# Patient Record
Sex: Female | Born: 1985
Health system: Southern US, Community
[De-identification: ages and names within clinical notes are randomized; demographics above are authoritative.]

## PROBLEM LIST (undated history)

## (undated) DIAGNOSIS — O009 Unspecified ectopic pregnancy without intrauterine pregnancy: Secondary | ICD-10-CM

## (undated) DIAGNOSIS — R87629 Unspecified abnormal cytological findings in specimens from vagina: Secondary | ICD-10-CM

## (undated) DIAGNOSIS — U071 COVID-19: Secondary | ICD-10-CM

## (undated) DIAGNOSIS — K5190 Ulcerative colitis, unspecified, without complications: Secondary | ICD-10-CM

## (undated) DIAGNOSIS — F419 Anxiety disorder, unspecified: Secondary | ICD-10-CM

## (undated) DIAGNOSIS — O039 Complete or unspecified spontaneous abortion without complication: Secondary | ICD-10-CM

## (undated) HISTORY — DX: Unspecified ectopic pregnancy without intrauterine pregnancy: O00.90

## (undated) HISTORY — PX: NO PAST SURGERIES: SHX2092

## (undated) HISTORY — DX: Unspecified abnormal cytological findings in specimens from vagina: R87.629

## (undated) HISTORY — DX: Anxiety disorder, unspecified: F41.9

## (undated) HISTORY — DX: Complete or unspecified spontaneous abortion without complication: O03.9

---
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
## 2011-10-28 ENCOUNTER — Emergency Department (HOSPITAL_COMMUNITY)
Admission: EM | Admit: 2011-10-28 | Discharge: 2011-10-28 | Disposition: A | Discharge status: Home / Self Care | Payer: Medicaid Other | Attending: Emergency Medicine | Admitting: Emergency Medicine

## 2011-10-28 ENCOUNTER — Encounter: Payer: Self-pay | Admitting: *Deleted

## 2011-10-28 DIAGNOSIS — R110 Nausea: Secondary | ICD-10-CM | POA: Insufficient documentation

## 2011-10-28 DIAGNOSIS — R1032 Left lower quadrant pain: Secondary | ICD-10-CM | POA: Insufficient documentation

## 2011-10-28 DIAGNOSIS — R10814 Left lower quadrant abdominal tenderness: Secondary | ICD-10-CM | POA: Insufficient documentation

## 2011-10-28 LAB — URINALYSIS, ROUTINE W REFLEX MICROSCOPIC
Glucose, UA: NEGATIVE mg/dL
Leukocytes, UA: NEGATIVE
Nitrite: NEGATIVE
pH: 6 (ref 5.0–8.0)

## 2011-10-28 LAB — URINE MICROSCOPIC-ADD ON

## 2011-10-28 LAB — DIFFERENTIAL
Lymphocytes Relative: 32 % (ref 12–46)
Lymphs Abs: 4.1 10*3/uL — ABNORMAL HIGH (ref 0.7–4.0)
Monocytes Relative: 6 % (ref 3–12)
Neutro Abs: 7.4 10*3/uL (ref 1.7–7.7)
Neutrophils Relative %: 59 % (ref 43–77)

## 2011-10-28 LAB — CBC
HCT: 42.9 % (ref 36.0–46.0)
Hemoglobin: 14.8 g/dL (ref 12.0–15.0)
MCH: 30 pg (ref 26.0–34.0)
MCHC: 34.5 g/dL (ref 30.0–36.0)
MCV: 87 fL (ref 78.0–100.0)
Platelets: 206 10*3/uL (ref 150–400)
RBC: 4.93 MIL/uL (ref 3.87–5.11)
RDW: 11.8 % (ref 11.5–15.5)
WBC: 12.6 10*3/uL — ABNORMAL HIGH (ref 4.0–10.5)

## 2011-10-28 LAB — COMPREHENSIVE METABOLIC PANEL
ALT: 24 U/L (ref 0–35)
AST: 18 U/L (ref 0–37)
Albumin: 4.2 g/dL (ref 3.5–5.2)
CO2: 28 mEq/L (ref 19–32)
Calcium: 9.7 mg/dL (ref 8.4–10.5)
Chloride: 100 mEq/L (ref 96–112)
Creatinine, Ser: 0.67 mg/dL (ref 0.50–1.10)
GFR calc non Af Amer: >90 mL/min (ref >90)
Sodium: 138 mEq/L (ref 135–145)

## 2011-10-28 LAB — PREGNANCY, URINE: Preg Test, Ur: POSITIVE

## 2011-10-28 MED ORDER — IBUPROFEN 800 MG PO TABS
800.0000 mg | ORAL_TABLET | Freq: Once | ORAL | Status: AC
  Administered 2011-10-28: 800 mg via ORAL
  Filled 2011-10-28: qty 1

## 2011-10-28 MED ORDER — OXYCODONE-ACETAMINOPHEN 5-325 MG PO TABS
1.0000 | ORAL_TABLET | Freq: Once | ORAL | Status: AC
  Administered 2011-10-28: 1 via ORAL
  Filled 2011-10-28: qty 1

## 2011-10-28 NOTE — ED Notes (Signed)
Pt given discharge instructions, paperwork, pt verbalized understanding.   

## 2011-10-28 NOTE — ED Notes (Addendum)
Pt reports having abdominal pain x 2 weeks. Pt also states taking a pregnancy test 2 weeks ago that was positive. Pt states she began bleeding approximately 2 weeks ago and complains of left lower suprapubic pain 5/10. Pt denies any nausea or vomiting. Pt denies any urinary complaints or vaginal discharge. Positive bowel sounds heard in al four quadrants.

## 2011-10-28 NOTE — ED Notes (Signed)
Pt requesting medication for pain, MD notified  

## 2011-10-28 NOTE — ED Notes (Addendum)
MD at bedside. 

## 2011-10-28 NOTE — ED Notes (Signed)
C/o LLQ pain onset yesterday; reports positive pregnancy tests x 2; states began bleeding on the 25th-hx of 2 prior miscarriages; hx of ectopic pregnancy; denies n/v/d; c/o spotting presently.

## 2011-10-28 NOTE — ED Provider Notes (Signed)
History   Scribed for Benny Lennert, MD, the patient was seen in room APA09/APA09 . This chart was scribed by Ellie Lunch.   CSN: [id number] Arrival date & time: 10/28/2011  6:38 PM   First MD Initiated Contact with Patient 10/28/11 1858      Chief Complaint  Patient presents with  . Abdominal Pain    (Consider location/radiation/quality/duration/timing/severity/associated sxs/prior treatment) HPI Cynthia Hood is a 25 y.o. female who presents to the Emergency Department complaining of constant LLQ abdominal pain since yesterday. Pt states radiates to groin. Pt states she might be pregnant at this time. Pt denies fever, cough and nausea. Pt reports similar sx with hx of ectopic pregnancy.  [facility]    History reviewed. No pertinent past medical history.  History reviewed. No pertinent past surgical history.  No family history on file.  History  Substance Use Topics  . Smoking status: Not on file  . Smokeless tobacco: Not on file  . Alcohol Use:     OB History    Grav Para Term Preterm Abortions TAB SAB Ect Mult Living   3    3           Review of Systems  Constitutional: Negative for fever and fatigue.  HENT: Negative for congestion, sinus pressure and ear discharge.   Eyes: Negative for discharge.  Respiratory: Negative for cough.   Cardiovascular: Negative for chest pain.  Gastrointestinal: Positive for nausea and abdominal pain. Negative for vomiting and diarrhea.  Genitourinary: Negative for frequency and hematuria.  Musculoskeletal: Negative for back pain.  Skin: Negative for rash.  Neurological: Negative for seizures and headaches.  Hematological: Negative.   Psychiatric/Behavioral: Negative for hallucinations.    Allergies  Review of patient's allergies indicates no known allergies.  Home Medications   Current Outpatient Rx  Name Route Sig Dispense Refill  . GOODY HEADACHE PO Oral Take 1 packet by mouth as needed. For pain     .  LORAZEPAM 0.5 MG PO TABS Oral Take 0.5 mg by mouth 2 (two) times daily as needed. For nerves     . ZOLPIDEM TARTRATE 10 MG PO TABS Oral Take 10 mg by mouth at bedtime as needed. For sleep      Triage VS BP 116/76  Pulse 77  Temp(Src) 98.1 F (36.7 C) (Oral)  Resp 20  Ht 5\' 2"  (1.575 m)  Wt 140 lb (63.504 kg)  BMI 25.61 kg/m2  SpO2 100%  LMP 08/21/2011  Physical Exam  Nursing note and vitals reviewed. Constitutional: She is oriented to person, place, and time. She appears well-developed and well-nourished.  HENT:  Head: Normocephalic and atraumatic.  Eyes: Conjunctivae and EOM are normal. No scleral icterus.  Neck: Neck supple. No thyromegaly present.  Cardiovascular: Normal rate and regular rhythm.  Exam reveals no gallop and no friction rub.   No murmur heard. Pulmonary/Chest: No stridor. She has no wheezes. She has no rales. She exhibits no tenderness.  Abdominal: She exhibits no distension. There is tenderness (moderate tenderness in LLQ). There is no rebound.  Genitourinary: There is tenderness and bleeding around the vagina.       Small amount of bleeding noted. Mild left adnexal tenderness noted. Cervix is closed and no CMT.   Musculoskeletal: Normal range of motion. She exhibits no edema.  Lymphadenopathy:    She has no cervical adenopathy.  Neurological: She is oriented to person, place, and time. Coordination normal.  Skin: No rash noted. No erythema.  Psychiatric: She has a normal mood and affect. Her behavior is normal.    ED Course  Procedures (including critical care time) DIAGNOSTIC STUDIES: Oxygen Saturation is 100% on RA, normal by my interpretation.    COORDINATION OF CARE:  Results for orders placed during the hospital encounter of 10/28/11  CBC      Component Value Range   WBC 12.6 (*) 4.0 - 10.5 (K/uL)   RBC 4.93  3.87 - 5.11 (MIL/uL)   Hemoglobin 14.8  12.0 - 15.0 (g/dL)   HCT 78.2  95.6 - 21.3 (%)   MCV 87.0  78.0 - 100.0 (fL)   MCH 30.0  26.0  - 34.0 (pg)   MCHC 34.5  30.0 - 36.0 (g/dL)   RDW 08.6  57.8 - 46.9 (%)   Platelets 206  150 - 400 (K/uL)  DIFFERENTIAL      Component Value Range   Neutrophils Relative 59  43 - 77 (%)   Neutro Abs 7.4  1.7 - 7.7 (K/uL)   Lymphocytes Relative 32  12 - 46 (%)   Lymphs Abs 4.1 (*) 0.7 - 4.0 (K/uL)   Monocytes Relative 6  3 - 12 (%)   Monocytes Absolute 0.7  0.1 - 1.0 (K/uL)   Eosinophils Relative 3  0 - 5 (%)   Eosinophils Absolute 0.3  0.0 - 0.7 (K/uL)   Basophils Relative 1  0 - 1 (%)   Basophils Absolute 0.1  0.0 - 0.1 (K/uL)  PREGNANCY, URINE      Component Value Range   Preg Test, Ur POSITIVE    URINALYSIS, ROUTINE W REFLEX MICROSCOPIC      Component Value Range   Color, Urine YELLOW  YELLOW    APPearance CLEAR  CLEAR    Specific Gravity, Urine >1.030 (*) 1.005 - 1.030    pH 6.0  5.0 - 8.0    Glucose, UA NEGATIVE  NEGATIVE (mg/dL)   Hgb urine dipstick MODERATE (*) NEGATIVE    Bilirubin Urine NEGATIVE  NEGATIVE    Ketones, ur NEGATIVE  NEGATIVE (mg/dL)   Protein, ur NEGATIVE  NEGATIVE (mg/dL)   Urobilinogen, UA 0.2  0.0 - 1.0 (mg/dL)   Nitrite NEGATIVE  NEGATIVE    Leukocytes, UA NEGATIVE  NEGATIVE   COMPREHENSIVE METABOLIC PANEL      Component Value Range   Sodium 138  135 - 145 (mEq/L)   Potassium 3.5  3.5 - 5.1 (mEq/L)   Chloride 100  96 - 112 (mEq/L)   CO2 28  19 - 32 (mEq/L)   Glucose, Bld 83  70 - 99 (mg/dL)   BUN 12  6 - 23 (mg/dL)   Creatinine, Ser 6.29  0.50 - 1.10 (mg/dL)   Calcium 9.7  8.4 - 52.8 (mg/dL)   Total Protein 7.7  6.0 - 8.3 (g/dL)   Albumin 4.2  3.5 - 5.2 (g/dL)   AST 18  0 - 37 (U/L)   ALT 24  0 - 35 (U/L)   Alkaline Phosphatase 79  39 - 117 (U/L)   Total Bilirubin 0.3  0.3 - 1.2 (mg/dL)   GFR calc non Af Amer >90  >90 (mL/min)   GFR calc Af Amer >90  >90 (mL/min)  POCT PREGNANCY, URINE      Component Value Range   Preg Test, Ur NEGATIVE    URINE MICROSCOPIC-ADD ON      Component Value Range   Squamous Epithelial / LPF RARE  RARE     WBC, UA 0-2  <  3 (WBC/hpf)   RBC / HPF 3-6  <3 (RBC/hpf)   Bacteria, UA RARE  RARE   HCG, QUANTITATIVE, PREGNANCY      Component Value Range   hCG, Beta Chain, Quant, S 88 (*) <5 (mIU/mL)    1. Abdominal pain    I spoke with gyn md and it was decided the pt could be follow up in 1-2 days  MDM   The chart was scribed for me under my direct supervision.  I personally performed the history, physical, and medical decision making and all procedures in the evaluation of this patient.Benny Lennert, MD 10/28/11 [phone]

## 2011-10-30 LAB — GC/CHLAMYDIA PROBE AMP, GENITAL
Chlamydia, DNA Probe: NEGATIVE
GC Probe Amp, Genital: NEGATIVE

## 2012-06-28 ENCOUNTER — Emergency Department (HOSPITAL_COMMUNITY)
Admission: EM | Admit: 2012-06-28 | Discharge: 2012-06-28 | Disposition: A | Discharge status: Home / Self Care | Payer: Medicaid Other | Attending: Emergency Medicine | Admitting: Emergency Medicine

## 2012-06-28 ENCOUNTER — Encounter (HOSPITAL_COMMUNITY): Payer: Self-pay

## 2012-06-28 ENCOUNTER — Emergency Department (HOSPITAL_COMMUNITY): Payer: Medicaid Other

## 2012-06-28 DIAGNOSIS — R079 Chest pain, unspecified: Secondary | ICD-10-CM | POA: Insufficient documentation

## 2012-06-28 DIAGNOSIS — R0602 Shortness of breath: Secondary | ICD-10-CM | POA: Insufficient documentation

## 2012-06-28 DIAGNOSIS — F17200 Nicotine dependence, unspecified, uncomplicated: Secondary | ICD-10-CM | POA: Insufficient documentation

## 2012-06-28 DIAGNOSIS — I498 Other specified cardiac arrhythmias: Secondary | ICD-10-CM | POA: Insufficient documentation

## 2012-06-28 DIAGNOSIS — Z79899 Other long term (current) drug therapy: Secondary | ICD-10-CM | POA: Insufficient documentation

## 2012-06-28 LAB — CBC WITH DIFFERENTIAL/PLATELET
Basophils Absolute: 0 10*3/uL (ref 0.0–0.1)
Basophils Relative: 0 % (ref 0–1)
Eosinophils Absolute: 0.1 10*3/uL (ref 0.0–0.7)
Eosinophils Relative: 0 % (ref 0–5)
MCH: 30.1 pg (ref 26.0–34.0)
MCHC: 34.6 g/dL (ref 30.0–36.0)
MCV: 86.9 fL (ref 78.0–100.0)
Neutrophils Relative %: 81 % — ABNORMAL HIGH (ref 43–77)
Platelets: 220 10*3/uL (ref 150–400)
RBC: 5.05 MIL/uL (ref 3.87–5.11)
RDW: 12 % (ref 11.5–15.5)

## 2012-06-28 LAB — BASIC METABOLIC PANEL
Calcium: 9.9 mg/dL (ref 8.4–10.5)
GFR calc Af Amer: >90 mL/min (ref >90)
GFR calc non Af Amer: >90 mL/min (ref >90)
Glucose, Bld: 118 mg/dL — ABNORMAL HIGH (ref 70–99)
Potassium: 3.5 mEq/L (ref 3.5–5.1)
Sodium: 139 mEq/L (ref 135–145)

## 2012-06-28 LAB — PROTIME-INR
INR: 1.02 (ref 0.00–1.49)
Prothrombin Time: 13.6 seconds (ref 11.6–15.2)

## 2012-06-28 LAB — APTT: aPTT: 30 seconds (ref 24–37)

## 2012-06-28 MED ORDER — FENTANYL CITRATE 0.05 MG/ML IJ SOLN
50.0000 ug | Freq: Once | INTRAMUSCULAR | Status: AC
  Administered 2012-06-28: 50 ug via INTRAVENOUS
  Filled 2012-06-28: qty 2

## 2012-06-28 MED ORDER — IOHEXOL 350 MG/ML SOLN
100.0000 mL | Freq: Once | INTRAVENOUS | Status: AC | PRN
  Administered 2012-06-28: 100 mL via INTRAVENOUS

## 2012-06-28 NOTE — ED Provider Notes (Signed)
History    This chart was scribed for Venola Castello B. Bernette Mayers, MD, MD by Smitty Pluck. The patient was seen in room APA14 and the patient's care was started at 12:23PM.   CSN: [id number]  Arrival date & time 06/28/12  1142   First MD Initiated Contact with Patient 06/28/12 1220      Chief Complaint  Patient presents with  . Chest Pain    (Consider location/radiation/quality/duration/timing/severity/associated sxs/prior treatment) Patient is a 26 y.o. female presenting with chest pain. The history is provided by the patient.  Chest Pain    Cynthia Hood is a 26 y.o. female who presents to the Emergency Department complaining of moderate nausea onset 1 day ago and mid sternal and left chest pain onset today at 10AM. Pt reports that her left arm was hurting during episode of chest pain. She reports having SOB. Denies emesis. Pt reports that she uses birth control and smokes cigarettes. Denies hx of similar symptoms, anxiety and panic attacks. Pain is aggravated by lying flat. LMP was within last 1 week. Denies recent long travel and hx of blood clots. Pain is rated at 6/10.  PCP is Dr. Christell Constant.    History reviewed. No pertinent past medical history.  History reviewed. No pertinent past surgical history.  No family history on file.  History  Substance Use Topics  . Smoking status: Current Everyday Smoker  . Smokeless tobacco: Not on file  . Alcohol Use: No    OB History    Grav Para Term Preterm Abortions TAB SAB Ect Mult Living   3    3           Review of Systems  Cardiovascular: Positive for chest pain.  All other systems reviewed and are negative.   10 Systems reviewed and all are negative for acute change except as noted in the HPI.   Allergies  Review of patient's allergies indicates no known allergies.  Home Medications   Current Outpatient Rx  Name Route Sig Dispense Refill  . ORTHO TRI-CYCLEN (28) PO Oral Take 1 tablet by mouth daily.      BP 141/85  Pulse  121  Temp 97.7 F (36.5 C) (Oral)  Resp 18  Ht 5\' 2"  (1.575 m)  SpO2 99%  LMP 06/23/2012  Physical Exam  Nursing note and vitals reviewed. Constitutional: She is oriented to person, place, and time. She appears well-developed and well-nourished.  HENT:  Head: Normocephalic and atraumatic.  Eyes: EOM are normal. Pupils are equal, round, and reactive to light.  Neck: Normal range of motion. Neck supple.  Cardiovascular: Normal heart sounds and intact distal pulses.  Tachycardia present.   Pulmonary/Chest: Effort normal and breath sounds normal.  Abdominal: Bowel sounds are normal. She exhibits no distension. There is no tenderness.  Musculoskeletal: Normal range of motion. She exhibits no edema and no tenderness.  Neurological: She is alert and oriented to person, place, and time. She has normal strength. No cranial nerve deficit or sensory deficit.  Skin: Skin is warm and dry. No rash noted.  Psychiatric: She has a normal mood and affect.    ED Course  Procedures (including critical care time) DIAGNOSTIC STUDIES: Oxygen Saturation is 99% on room air, normal by my interpretation.    COORDINATION OF CARE: 12:26PM EDP discusses pt ED treatment with pt  12:26PM EDP orders medication:  Scheduled Meds:   . fentaNYL  50 mcg Intravenous Once   Continuous Infusions:  PRN Meds:.    Labs Reviewed -  No data to display No results found.   No diagnosis found.    MDM   Date: 06/28/2012  Rate: 115  Rhythm: sinus tachycardia  QRS Axis: right  Intervals: normal  ST/T Wave abnormalities: normal  Conduction Disutrbances:none  Narrative Interpretation:   Old EKG Reviewed: none available     I personally performed the services described in the documentation, which were scribed in my presence. The recorded information has been reviewed and considered.   Pt with CP, tachycardia who is on birth control and a smoker. Sent for CTA which was neg for PE. HR improved with rest.  Still having some pain, but improved. No concern for CAD.Advised to followup with PCP as needed.       Srinidhi Landers B. Bernette Mayers, MD 06/28/12 1358

## 2012-06-28 NOTE — ED Notes (Signed)
Pt reports left sided chest pain that began a few hours ago.  Pt reports the pain began to radiate to her left arm.  Pt also reports diaphoresis, dizziness, and some sob.  Pt denies any drug use.

## 2013-04-18 ENCOUNTER — Encounter: Payer: Self-pay | Admitting: Adult Health

## 2013-04-18 ENCOUNTER — Ambulatory Visit (INDEPENDENT_AMBULATORY_CARE_PROVIDER_SITE_OTHER): Payer: Medicaid Other | Admitting: Adult Health

## 2013-04-18 ENCOUNTER — Encounter: Payer: Self-pay | Admitting: *Deleted

## 2013-04-18 VITALS — BP 110/72 | Ht 62.0 in | Wt 150.8 lb

## 2013-04-18 DIAGNOSIS — Z3201 Encounter for pregnancy test, result positive: Secondary | ICD-10-CM

## 2013-04-18 DIAGNOSIS — Z3200 Encounter for pregnancy test, result unknown: Secondary | ICD-10-CM

## 2013-04-18 DIAGNOSIS — O09291 Supervision of pregnancy with other poor reproductive or obstetric history, first trimester: Secondary | ICD-10-CM

## 2013-04-18 DIAGNOSIS — O09299 Supervision of pregnancy with other poor reproductive or obstetric history, unspecified trimester: Secondary | ICD-10-CM

## 2013-04-18 NOTE — Patient Instructions (Addendum)
Call for labs in am return in 1 week for [place]

## 2013-04-18 NOTE — Progress Notes (Signed)
Subjective:     Patient ID: Cynthia Hood, female   DOB: July 16, 1986, 27 y.o.   MRN: [id number]  HPI Cynthia Hood is a 27 year old white female in with + pregnancy test at home, and history of 3 miscarriage one being ? ectoptic.  Review of Systems No complaints Reviewed past medical,surgical, social and family history. Reviewed medications and allergies.     Objective:   Physical Exam Blood pressure 110/72, height 5\' 2"  (1.575 m), weight 150 lb 12.8 oz (68.402 kg), last menstrual period 03/19/2013.Positive urine pregnancy test Got prenatal vitamins at clinic, and wants early labs, will check QHCG and progesterone level, her blood type is 0+.     Assessment:      Early pregnancy G4P0    Plan:      Check QHCG and progesterone level Take vitamins Call in am for results Return in 1 week for [place]

## 2013-04-19 ENCOUNTER — Telehealth: Payer: Self-pay | Admitting: Adult Health

## 2013-04-19 LAB — HCG, QUANTITATIVE, PREGNANCY: hCG, Beta Chain, Quant, S: 47.7 m[IU]/mL

## 2013-04-19 MED ORDER — PROGESTERONE 50 MG VA SUPP: 50.0000 mg | Freq: Two times a day (BID) | VAGINAL | Status: DC

## 2013-04-19 NOTE — Telephone Encounter (Signed)
Pt has QHCG of 47.7 and progesterone level of 5 will Rx progesterone supp 50 mg 1 bid and re check [facility] 5/29 at 3pm.Pt aware of this.

## 2013-04-19 NOTE — Telephone Encounter (Signed)
Pt informed of QHCG level of 47.7 and Progesterone level of 5.0, informed pt would discuss results with Cyril Mourning, NP and would follow up with patient.

## 2013-04-20 ENCOUNTER — Other Ambulatory Visit: Payer: Medicaid Other

## 2013-04-20 DIAGNOSIS — O200 Threatened abortion: Secondary | ICD-10-CM

## 2013-04-21 ENCOUNTER — Telehealth: Payer: Self-pay | Admitting: Adult Health

## 2013-04-21 LAB — HCG, QUANTITATIVE, PREGNANCY: hCG, Beta Chain, Quant, S: 50.1 m[IU]/mL

## 2013-04-21 NOTE — Telephone Encounter (Signed)
Pt informed of QHCG results of 50.1, appt made for Monday for recheck [facility] per Cyril Mourning, NP

## 2013-04-24 ENCOUNTER — Other Ambulatory Visit: Payer: Medicaid Other

## 2013-04-24 DIAGNOSIS — O200 Threatened abortion: Secondary | ICD-10-CM

## 2013-04-25 ENCOUNTER — Encounter: Payer: Self-pay | Admitting: Adult Health

## 2013-04-25 ENCOUNTER — Ambulatory Visit (INDEPENDENT_AMBULATORY_CARE_PROVIDER_SITE_OTHER): Payer: Medicaid Other | Admitting: Adult Health

## 2013-04-25 ENCOUNTER — Ambulatory Visit (INDEPENDENT_AMBULATORY_CARE_PROVIDER_SITE_OTHER): Payer: Medicaid Other

## 2013-04-25 ENCOUNTER — Other Ambulatory Visit: Payer: Self-pay | Admitting: Adult Health

## 2013-04-25 ENCOUNTER — Encounter: Payer: Medicaid Other | Admitting: Adult Health

## 2013-04-25 ENCOUNTER — Telehealth: Payer: Self-pay | Admitting: Obstetrics and Gynecology

## 2013-04-25 VITALS — BP 110/72 | Ht 62.0 in | Wt 153.0 lb

## 2013-04-25 DIAGNOSIS — O3680X0 Pregnancy with inconclusive fetal viability, not applicable or unspecified: Secondary | ICD-10-CM

## 2013-04-25 DIAGNOSIS — O09291 Supervision of pregnancy with other poor reproductive or obstetric history, first trimester: Secondary | ICD-10-CM

## 2013-04-25 DIAGNOSIS — O200 Threatened abortion: Secondary | ICD-10-CM

## 2013-04-25 NOTE — Progress Notes (Signed)
Subjective:     Patient ID: Cynthia Hood, female   DOB: January 31, 1986, 27 y.o.   MRN: [id number]  HPI Cynthia Hood is a 27 year old white female in for [place]. Her LMP was 03/19/13 and she had a + home pregnancy test 5/15.Her QHCG here was 47.7 on 5/27 and her progesterone was 5.She was started on progesterone 50 mg supp bid. On 5/29 her QHCG was 50.1, on 6/2 her QHCG was 169.9.Today the US showed no definite IUP,right and left ovary was seen, ?mass inferior to left ovary, cannot rule out ectopic. She is wiping pink today, no pain. Review of Systems Positives as in HPI  Reviewed past medical,surgical, social and family history. Reviewed medications and allergies.     Objective:   Physical Exam BP 110/72  Ht 5\' 2"  (1.575 m)  Wt 153 lb (69.4 kg)  BMI 27.98 kg/m2  LMP 03/19/2013   Discussed [place] and labs with pt.Discussed with Dr. Despina Hood, and will get follow up [facility] and [place] in 1 week Assessment:      Threatened AB, can not r/o ectopic    Plan:      Return in 1 week for [place] and [place] [facility] If has any pain go to the ER, pt voices understanding.

## 2013-04-25 NOTE — Patient Instructions (Addendum)
Come back in 1 week for pelvic US and QHCG  If has pain go to ER

## 2013-04-25 NOTE — Telephone Encounter (Signed)
Pt informed of QHCG results of 169.9 and to keep her appt today for U/S per Cyril Mourning, NP.

## 2013-04-26 ENCOUNTER — Other Ambulatory Visit: Payer: Self-pay | Admitting: Obstetrics & Gynecology

## 2013-04-26 DIAGNOSIS — O200 Threatened abortion: Secondary | ICD-10-CM

## 2013-04-26 NOTE — Progress Notes (Signed)
This encounter was created in error - please disregard.

## 2013-05-02 ENCOUNTER — Ambulatory Visit (INDEPENDENT_AMBULATORY_CARE_PROVIDER_SITE_OTHER): Payer: Medicaid Other | Admitting: Adult Health

## 2013-05-02 ENCOUNTER — Encounter: Payer: Self-pay | Admitting: Adult Health

## 2013-05-02 ENCOUNTER — Ambulatory Visit (INDEPENDENT_AMBULATORY_CARE_PROVIDER_SITE_OTHER): Payer: Medicaid Other

## 2013-05-02 VITALS — BP 112/76 | Ht 62.0 in | Wt 153.0 lb

## 2013-05-02 DIAGNOSIS — O200 Threatened abortion: Secondary | ICD-10-CM

## 2013-05-02 NOTE — Patient Instructions (Addendum)
Will call this pm 

## 2013-05-02 NOTE — Progress Notes (Signed)
Subjective:     Patient ID: Cynthia Hood, female   DOB: 09-21-1986, 27 y.o.   MRN: [id number]  HPI Cynthia Hood is here for follow up US and QHCG.  Review of Systems No complaints Reviewed past medical,surgical, social and family history. Reviewed medications and allergies.     Objective:   Physical Exam BP 112/76  Ht 5\' 2"  (1.575 m)  Wt 153 lb (69.4 kg)  BMI 27.98 kg/m2  LMP 03/19/2013   Reviewed [place], still no IUP, and can not r/o ectopic yet Assessment:      Threatened miscarriage, can not r/o ectopic     Plan:      Stat QHCG Re check QHCG in 2 days

## 2013-05-03 ENCOUNTER — Telehealth: Payer: Self-pay | Admitting: Adult Health

## 2013-05-03 LAB — US OB TRANSVAGINAL

## 2013-05-03 NOTE — Telephone Encounter (Signed)
Spoke with pt. Cramping since last pm. Bleeding x 1-2 weeks, bright red/brown. Pt wants to know if you can prescribe something to speed up the process. Uses [facility] in [place]. Spoke with [employer]. Will speak with Dr. Despina Hidden when he comes in. Will get back in touch with her after 2pm today.

## 2013-05-04 ENCOUNTER — Other Ambulatory Visit: Payer: Medicaid Other

## 2013-05-09 NOTE — Telephone Encounter (Signed)
Left message. Pt to call back. Needs to schedule an appt per JAG. JSY

## 2013-05-11 ENCOUNTER — Other Ambulatory Visit: Payer: Medicaid Other

## 2013-05-11 DIAGNOSIS — O200 Threatened abortion: Secondary | ICD-10-CM

## 2013-05-12 ENCOUNTER — Telehealth: Payer: Self-pay | Admitting: Adult Health

## 2013-05-12 NOTE — Telephone Encounter (Signed)
Left message that [facility] was 6.3 and we need to recheck in 2 weeks

## 2013-05-19 DIAGNOSIS — Z3202 Encounter for pregnancy test, result negative: Secondary | ICD-10-CM | POA: Insufficient documentation

## 2013-05-19 DIAGNOSIS — K5900 Constipation, unspecified: Secondary | ICD-10-CM | POA: Insufficient documentation

## 2013-05-19 DIAGNOSIS — Z87891 Personal history of nicotine dependence: Secondary | ICD-10-CM | POA: Insufficient documentation

## 2013-05-19 DIAGNOSIS — K6289 Other specified diseases of anus and rectum: Secondary | ICD-10-CM | POA: Insufficient documentation

## 2013-05-19 DIAGNOSIS — R109 Unspecified abdominal pain: Secondary | ICD-10-CM | POA: Insufficient documentation

## 2013-05-19 DIAGNOSIS — M549 Dorsalgia, unspecified: Secondary | ICD-10-CM | POA: Insufficient documentation

## 2013-05-20 ENCOUNTER — Encounter (HOSPITAL_COMMUNITY): Payer: Self-pay

## 2013-05-20 ENCOUNTER — Emergency Department (HOSPITAL_COMMUNITY)
Admission: EM | Admit: 2013-05-20 | Discharge: 2013-05-20 | Disposition: A | Discharge status: Home / Self Care | Payer: Medicaid Other | Attending: Emergency Medicine | Admitting: Emergency Medicine

## 2013-05-20 ENCOUNTER — Emergency Department (HOSPITAL_COMMUNITY): Payer: Medicaid Other

## 2013-05-20 DIAGNOSIS — R109 Unspecified abdominal pain: Secondary | ICD-10-CM

## 2013-05-20 DIAGNOSIS — K5900 Constipation, unspecified: Secondary | ICD-10-CM

## 2013-05-20 LAB — URINALYSIS, ROUTINE W REFLEX MICROSCOPIC
Glucose, UA: NEGATIVE mg/dL
Leukocytes, UA: NEGATIVE
Protein, ur: NEGATIVE mg/dL
Specific Gravity, Urine: 1.025 (ref 1.005–1.030)
Urobilinogen, UA: 0.2 mg/dL (ref 0.0–1.0)

## 2013-05-20 LAB — PREGNANCY, URINE: Preg Test, Ur: NEGATIVE

## 2013-05-20 LAB — HCG, QUANTITATIVE, PREGNANCY: hCG, Beta Chain, Quant, S: 1 m[IU]/mL (ref <5)

## 2013-05-20 LAB — URINE MICROSCOPIC-ADD ON

## 2013-05-20 MED ORDER — HYDROCODONE-ACETAMINOPHEN 5-325 MG PO TABS
1.0000 | ORAL_TABLET | Freq: Once | ORAL | Status: AC
  Administered 2013-05-20: 1 via ORAL
  Filled 2013-05-20: qty 1

## 2013-05-20 NOTE — ED Notes (Signed)
Pain in left lower abdomen, lower back, and rectal area. Pain shooting up both sides per pt. Last normal period was April 27th, was pregnant, started bleeding the first week of June and my numbers were high, last week my numbers were low per pt. Have followed up with an OBGYN per pt.

## 2013-05-20 NOTE — ED Provider Notes (Signed)
History    CSN: [id number] Arrival date & time 05/19/13  2328  First MD Initiated Contact with Patient 05/20/13 0057     Chief Complaint  Patient presents with  . Abdominal Pain  . Back Pain  . Rectal Pain   (Consider location/radiation/quality/duration/timing/severity/associated sxs/prior Treatment) HPI HPI Comments: Cynthia Hood is a 27 y.o. female who presents to the Emergency Department complaining of Left lower abdominal pain and back pain for three days. She has had irregular bowel movements in that time. She may be pregnant. Her last period was April 27th. She had bleeding the first week of June and her HCG numbers have been going down since. She is being followed by GYN. She has an appointment next week for a retake of the HCG.    PCP Dr. Christell Constant Past Medical History  Diagnosis Date  . Ectopic pregnancy    Past Surgical History  Procedure Laterality Date  . No past surgeries     Family History  Problem Relation Age of Onset  . Cancer Other   . Diabetes Father   . Hypertension Father   . Heart disease Father    History  Substance Use Topics  . Smoking status: Former Smoker -- 12 years    Types: Cigarettes    Quit date: 04/06/2013  . Smokeless tobacco: Never Used  . Alcohol Use: Yes     Comment: occ   OB History   Grav Para Term Preterm Abortions TAB SAB Ect Mult Living   4    3          Review of Systems  Constitutional: Negative for fever.       10 Systems reviewed and are negative for acute change except as noted in the HPI.  HENT: Negative for congestion.   Eyes: Negative for discharge and redness.  Respiratory: Negative for cough and shortness of breath.   Cardiovascular: Negative for chest pain.  Gastrointestinal: Positive for abdominal pain. Negative for vomiting.  Musculoskeletal: Negative for back pain.  Skin: Negative for rash.  Neurological: Negative for syncope, numbness and headaches.  Psychiatric/Behavioral:       No behavior change.     Allergies  Review of patient's allergies indicates no known allergies.  Home Medications   Current Outpatient Rx  Name  Route  Sig  Dispense  Refill  . prenatal vitamin w/FE, FA (PRENATAL 1 + 1) 27-1 MG TABS   Oral   Take 1 tablet by mouth daily at 12 noon.         . Progesterone 50 MG SUPP   Vaginal   Place 1 suppository (50 mg total) vaginally 2 (two) times daily.   30 suppository   12    BP 126/68  Pulse 95  Temp(Src) 97.9 F (36.6 C) (Oral)  Resp 18  Ht 5\' 2"  (1.575 m)  Wt 150 lb (68.04 kg)  BMI 27.43 kg/m2  SpO2 100%  LMP 03/19/2013  Breastfeeding? Unknown Physical Exam  Nursing note and vitals reviewed. Constitutional: She appears well-developed and well-nourished.  Awake, alert, nontoxic appearance.  HENT:  Head: Normocephalic and atraumatic.  Eyes: EOM are normal. Pupils are equal, round, and reactive to light.  Neck: Normal range of motion. Neck supple.  Cardiovascular: Normal rate and intact distal pulses.   Pulmonary/Chest: Effort normal and breath sounds normal. She exhibits no tenderness.  Abdominal: Soft. Bowel sounds are normal. There is no tenderness. There is no rebound.  Musculoskeletal: She exhibits no tenderness.  Baseline  ROM, no obvious new focal weakness.  Neurological:  Mental status and motor strength appears baseline for patient and situation.  Skin: No rash noted.  Psychiatric: She has a normal mood and affect.    ED Course  Procedures (including critical care time) Results for orders placed during the hospital encounter of 05/20/13  URINALYSIS, ROUTINE W REFLEX MICROSCOPIC      Result Value Range   Color, Urine YELLOW  YELLOW   APPearance CLEAR  CLEAR   Specific Gravity, Urine 1.025  1.005 - 1.030   pH 6.0  5.0 - 8.0   Glucose, UA NEGATIVE  NEGATIVE mg/dL   Hgb urine dipstick TRACE (*) NEGATIVE   Bilirubin Urine NEGATIVE  NEGATIVE   Ketones, ur NEGATIVE  NEGATIVE mg/dL   Protein, ur NEGATIVE  NEGATIVE mg/dL    Urobilinogen, UA 0.2  0.0 - 1.0 mg/dL   Nitrite NEGATIVE  NEGATIVE   Leukocytes, UA NEGATIVE  NEGATIVE  PREGNANCY, URINE      Result Value Range   Preg Test, Ur NEGATIVE  NEGATIVE  HCG, QUANTITATIVE, PREGNANCY      Result Value Range   hCG, Beta Chain, Quant, S 1  <5 mIU/mL  URINE MICROSCOPIC-ADD ON      Result Value Range   Squamous Epithelial / LPF FEW (*) RARE   WBC, UA 0-2  <3 WBC/hpf   RBC / HPF 0-2  <3 RBC/hpf   Bacteria, UA RARE  RARE   Dg Abd Acute W/chest  05/20/2013   *RADIOLOGY REPORT*  Clinical Data: Pain in left lower quadrant.  ACUTE ABDOMEN SERIES (ABDOMEN 2 VIEW & CHEST 1 VIEW)  Comparison: CT of 06/28/2012 chest.  No prior abdominal imaging.  Findings: Frontal view of the chest demonstrates midline trachea. Normal heart size and mediastinal contours. No pleural effusion or pneumothorax.  Mild lower lobe predominant interstitial thickening is suspected. No lobar consolidation.  Abominal films demonstrate no free intraperitoneal air or significant air fluid levels on upright positioning.  Moderate amount of ascending colonic stool.  No small bowel dilatation on supine imaging. Distal gas and stool.  No abnormal abdominal calcifications.   No appendicolith.  IMPRESSION: No acute abdominal findings.  Question constipation.  Lower lobe predominant interstitial thickening is likely the sequelae of chronic bronchitis/smoking.   Original Report Authenticated By: Jeronimo Greaves, M.D.    MDM  Patient presents with left lower abdominal pain and rectal pain. She is in the process of tracking HCG to determine pregnancy. HCG was 1. Acute abdominal series shows constipation. Reviewed the results with the patient.Pt stable in ED with no significant deterioration in condition.The patient appears reasonably screened and/or stabilized for discharge and I doubt any other medical condition or other [facility] requiring further screening, evaluation, or treatment in the ED at this time prior to  discharge.  MDM Reviewed: nursing note and vitals Interpretation: labs and x-ray     Nicoletta Dress. Colon Branch, MD 05/20/13 4540

## 2013-05-29 ENCOUNTER — Telehealth: Payer: Self-pay | Admitting: Family Medicine

## 2013-05-29 NOTE — Telephone Encounter (Signed)
Appt given for tomorrow afternoon per pt request

## 2013-05-30 ENCOUNTER — Ambulatory Visit (INDEPENDENT_AMBULATORY_CARE_PROVIDER_SITE_OTHER): Payer: Medicaid Other | Admitting: Family Medicine

## 2013-05-30 ENCOUNTER — Ambulatory Visit (INDEPENDENT_AMBULATORY_CARE_PROVIDER_SITE_OTHER): Payer: Medicaid Other

## 2013-05-30 ENCOUNTER — Encounter: Payer: Self-pay | Admitting: Family Medicine

## 2013-05-30 VITALS — BP 98/67 | HR 82 | Temp 97.9°F | Ht 62.0 in | Wt 153.0 lb

## 2013-05-30 DIAGNOSIS — M5431 Sciatica, right side: Secondary | ICD-10-CM

## 2013-05-30 DIAGNOSIS — M545 Low back pain, unspecified: Secondary | ICD-10-CM

## 2013-05-30 DIAGNOSIS — M5430 Sciatica, unspecified side: Secondary | ICD-10-CM

## 2013-05-30 DIAGNOSIS — S39012A Strain of muscle, fascia and tendon of lower back, initial encounter: Secondary | ICD-10-CM

## 2013-05-30 MED ORDER — PREDNISONE 50 MG PO TABS: ORAL_TABLET | ORAL | Status: DC

## 2013-05-30 MED ORDER — CYCLOBENZAPRINE HCL 10 MG PO TABS: 10.0000 mg | ORAL_TABLET | Freq: Three times a day (TID) | ORAL | Status: DC | PRN

## 2013-05-30 NOTE — Progress Notes (Signed)
  Subjective:    Patient ID: Cynthia Hood, female    DOB: Apr 15, 1986, 27 y.o.   MRN: [id number]  HPI Patient is in today with chief complaint of low back pain. Patient states she's had recurrent low back pain over the past 2-3 years. 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 Has had recurrent episodes of lumbar back pain. Patient states she's had acute flare of this over the past 3-4 months. Patient denies any recent strenuous activity. Pain is predominantly in the right side with some radicular symptoms into the right thigh and distal lower extremity. No bowel or bladder anesthesia. Patient does have some mucus in the right-sided times. No known trauma. Pain seems to be worse at night. Patient has not tried anything for pain.   Review of Systems  All other systems reviewed and are negative.       Objective:   Physical Exam  Constitutional: She appears well-developed and well-nourished.  HENT:  Head: Normocephalic and atraumatic.  Eyes: Conjunctivae are normal. Pupils are equal, round, and reactive to light.  Neck: Normal range of motion.  Cardiovascular: Normal rate and regular rhythm.   Pulmonary/Chest: Effort normal.  Abdominal: Soft.  Musculoskeletal:       Back:  + TTP in R LS area  + pain over affected area with FABER maneuvers.  neurovascularly intact distally    Neurological: She is alert.  Skin: Skin is warm.   [facility] reading (PRIMARY) by  Dr. Alvester Morin                                 Lumbar xrays preliminarily negative for any fracture or dislocation.         Assessment & Plan:  Low back pain - Plan: DG Lumbar Spine 2-3 Views  Lumbosacral strain, initial encounter - Plan: predniSONE (DELTASONE) 50 MG tablet, cyclobenzaprine (FLEXERIL) 10 MG tablet  Sciatica, right - Plan: predniSONE (DELTASONE) 50 MG tablet   Will start on prednisone Flexeril for treatment. Will also refer to physical therapy has been a chronic issue. X-rays  are preliminary negative for any acute abnormality. Followup pending formal radiology read. Discuss musculoskeletal and neurovascular red flags. Followup as needed.

## 2013-06-06 ENCOUNTER — Telehealth: Payer: Self-pay | Admitting: Family Medicine

## 2013-06-06 DIAGNOSIS — M549 Dorsalgia, unspecified: Secondary | ICD-10-CM

## 2013-06-07 NOTE — Telephone Encounter (Signed)
Discussed x-ray results. Pain has improved some with medication. I don't see that a PT referral has been done so I am ordering one.   Patient lives in [place] and would prefer an appt there if there is a PT dept.

## 2013-06-20 ENCOUNTER — Ambulatory Visit (HOSPITAL_COMMUNITY)
Admission: RE | Admit: 2013-06-20 | Discharge: 2013-06-20 | Disposition: A | Discharge status: Home / Self Care | Payer: Medicaid Other | Source: Ambulatory Visit | Attending: Family Medicine | Admitting: Family Medicine

## 2013-06-20 DIAGNOSIS — IMO0001 Reserved for inherently not codable concepts without codable children: Secondary | ICD-10-CM | POA: Insufficient documentation

## 2013-06-20 DIAGNOSIS — M545 Low back pain, unspecified: Secondary | ICD-10-CM | POA: Insufficient documentation

## 2013-06-20 NOTE — Evaluation (Signed)
Physical Therapy Evaluation  Patient Details  Name: Cynthia Hood MRN: [id number] Date of Birth: [birth year]  Today's Date: 06/20/2013 Time: 1430-1520 PT Time Calculation (min): 50 min Charge;  evaluation             Visit#: 1 of 1  Re-eval:   Assessment Diagnosis: radicular pain  Authorization: medicaid    Authorization Time Period:    Authorization Visit#:   of     Past Medical History:  Past Medical History  Diagnosis Date  . Ectopic pregnancy    Past Surgical History:  Past Surgical History  Procedure Laterality Date  . No past surgeries      Subjective Symptoms/Limitations Symptoms: Pt states that her back has been bothering her off and on for three years but the pain has increased significantly in the past few months.  She states bending increases her pain the most. How long can you sit comfortably?: leans to left if not she is able to sit for 30-40 minutes How long can you stand comfortably?: immediate pain How long can you walk comfortably?: walking increases pain  but she is able to walk without stopping Pain Assessment Currently in Pain?: Yes Pain Score: 7  (worst goes up 10/10) Pain Location: Back Pain Orientation: Right Pain Type: Chronic pain Pain Radiating Towards: to mid calf area Pain Onset: More than a month ago Pain Frequency: Constant Pain Relieving Factors: drape over a ball Effect of Pain on Daily Activities: increases pain  Prior Functioning  Prior Function Vocation: Unemployed Leisure: Hobbies-yes (Comment) Comments: ; gardening  Cognition/Observation Cognition Overall Cognitive Status: Within Functional Limits for tasks assessed  Sensation/Coordination/Flexibility/Functional Tests Functional Tests Functional Tests: oswestry 29/50  Assessment RLE Strength Right Hip Flexion: 5/5 Right Hip Extension: 3+/5 Right Hip ABduction: 3+/5 Right Knee Flexion: 3+/5 Right Knee Extension: 3+/5 LLE Strength Left Hip Flexion: 5/5 Left Hip  Extension: 4/5 Left Hip ABduction: 4/5 Left Knee Flexion: 3+/5 Left Knee Extension: 3+/5 Lumbar AROM Lumbar Flexion: decreased 70% with increased pain Lumbar Extension: decreased 30%  with increased Pain Lumbar - Right Side Bend: wnl Lumbar - Left Side Bend: wnl Lumbar - Right Rotation: decreased 10% Lumbar - Left Rotation: decreased 10%  Exercise/Treatments  Stretches Active Hamstring Stretch: 1 rep;30 seconds Single Knee to Chest Stretch: 1 rep;30 seconds   Supine Bridge: 5 reps Straight Leg Raise: 5 reps Sidelying Clam: 5 reps Hip Abduction: 5 reps Prone  Straight Leg Raise: 5 reps Opposite Arm/Leg Raise: 5 reps    Physical Therapy Assessment and Plan PT Assessment and Plan Clinical Impression Statement: Pt with increeased pain with both flexion and extension both weight bearing and non-weight bearing.  Pt demonstrates regional weakness and has complaint of pain.  Pt was educated in a stabilization exercise program. PT Plan: Pt insurance covers evaluation only.  Pt evaled and given a HEP    Goals Home Exercise Program Pt/caregiver will Perform Home Exercise Program: For increased ROM;For increased strengthening PT Goal: Perform Home Exercise Program - Progress: Goal set today PT Short Term Goals Time to Complete Short Term Goals: 2 weeks  Problem List Patient Active Problem List   Diagnosis Date Noted  . History of miscarriage, currently pregnant 04/18/2013    PT Plan of Care PT Home Exercise Plan: given  GP    RUSSELL,CINDY 06/20/2013, 5:15 PM  Physician Documentation Your signature is required to indicate approval of the treatment plan as stated above.  Please sign and either send electronically or make a copy of  this report for your files and return this physician signed original.   Please mark one 1.__approve of plan  2. ___approve of plan with the following conditions.   ______________________________                                                           _____________________ Physician Signature                                                                                                             Date

## 2013-06-29 ENCOUNTER — Telehealth: Payer: Self-pay | Admitting: Adult Health

## 2013-06-29 NOTE — Telephone Encounter (Signed)
Needs to make appt with Dr Despina Hidden to talk about recurrent miscarriage

## 2013-08-15 ENCOUNTER — Encounter (HOSPITAL_COMMUNITY): Payer: Self-pay

## 2013-08-15 ENCOUNTER — Emergency Department (HOSPITAL_COMMUNITY): Payer: Medicaid Other

## 2013-08-15 ENCOUNTER — Emergency Department (HOSPITAL_COMMUNITY)
Admission: EM | Admit: 2013-08-15 | Discharge: 2013-08-15 | Disposition: A | Discharge status: Home / Self Care | Payer: Medicaid Other | Attending: Emergency Medicine | Admitting: Emergency Medicine

## 2013-08-15 DIAGNOSIS — Z3202 Encounter for pregnancy test, result negative: Secondary | ICD-10-CM | POA: Insufficient documentation

## 2013-08-15 DIAGNOSIS — R197 Diarrhea, unspecified: Secondary | ICD-10-CM | POA: Insufficient documentation

## 2013-08-15 DIAGNOSIS — K529 Noninfective gastroenteritis and colitis, unspecified: Secondary | ICD-10-CM

## 2013-08-15 DIAGNOSIS — K5289 Other specified noninfective gastroenteritis and colitis: Secondary | ICD-10-CM | POA: Insufficient documentation

## 2013-08-15 DIAGNOSIS — F17200 Nicotine dependence, unspecified, uncomplicated: Secondary | ICD-10-CM | POA: Insufficient documentation

## 2013-08-15 DIAGNOSIS — N83209 Unspecified ovarian cyst, unspecified side: Secondary | ICD-10-CM | POA: Insufficient documentation

## 2013-08-15 DIAGNOSIS — R110 Nausea: Secondary | ICD-10-CM | POA: Insufficient documentation

## 2013-08-15 LAB — CBC WITH DIFFERENTIAL/PLATELET
Basophils Absolute: 0 10*3/uL (ref 0.0–0.1)
Basophils Relative: 0 % (ref 0–1)
Lymphocytes Relative: 27 % (ref 12–46)
MCHC: 34 g/dL (ref 30.0–36.0)
Monocytes Absolute: 0.6 10*3/uL (ref 0.1–1.0)
Neutro Abs: 6 10*3/uL (ref 1.7–7.7)
Neutrophils Relative %: 64 % (ref 43–77)
Platelets: 183 10*3/uL (ref 150–400)
RDW: 11.8 % (ref 11.5–15.5)
WBC: 9.4 10*3/uL (ref 4.0–10.5)

## 2013-08-15 LAB — COMPREHENSIVE METABOLIC PANEL
ALT: 25 U/L (ref 0–35)
AST: 16 U/L (ref 0–37)
Albumin: 3.7 g/dL (ref 3.5–5.2)
CO2: 28 mEq/L (ref 19–32)
Chloride: 102 mEq/L (ref 96–112)
Creatinine, Ser: 0.78 mg/dL (ref 0.50–1.10)
Potassium: 4.1 mEq/L (ref 3.5–5.1)
Sodium: 138 mEq/L (ref 135–145)
Total Bilirubin: <0.1 mg/dL — ABNORMAL LOW (ref 0.3–1.2)

## 2013-08-15 LAB — URINALYSIS, ROUTINE W REFLEX MICROSCOPIC
Bilirubin Urine: NEGATIVE
Glucose, UA: NEGATIVE mg/dL
Specific Gravity, Urine: >1.03 — ABNORMAL HIGH (ref 1.005–1.030)
Urobilinogen, UA: 0.2 mg/dL (ref 0.0–1.0)

## 2013-08-15 LAB — URINE MICROSCOPIC-ADD ON

## 2013-08-15 LAB — POCT PREGNANCY, URINE: Preg Test, Ur: NEGATIVE

## 2013-08-15 MED ORDER — SODIUM CHLORIDE 0.9 % IV BOLUS (SEPSIS)
1000.0000 mL | Freq: Once | INTRAVENOUS | Status: AC
  Administered 2013-08-15: 1000 mL via INTRAVENOUS

## 2013-08-15 MED ORDER — IOHEXOL 300 MG/ML  SOLN
50.0000 mL | Freq: Once | INTRAMUSCULAR | Status: AC | PRN
  Administered 2013-08-15: 50 mL via ORAL

## 2013-08-15 MED ORDER — CIPROFLOXACIN HCL 500 MG PO TABS: 500.0000 mg | ORAL_TABLET | Freq: Two times a day (BID) | ORAL | Status: DC

## 2013-08-15 MED ORDER — IOHEXOL 300 MG/ML  SOLN
100.0000 mL | Freq: Once | INTRAMUSCULAR | Status: AC | PRN
  Administered 2013-08-15: 100 mL via INTRAVENOUS

## 2013-08-15 MED ORDER — ONDANSETRON HCL 4 MG PO TABS: 4.0000 mg | ORAL_TABLET | Freq: Four times a day (QID) | ORAL | Status: DC

## 2013-08-15 MED ORDER — METRONIDAZOLE 500 MG PO TABS: 500.0000 mg | ORAL_TABLET | Freq: Two times a day (BID) | ORAL | Status: DC

## 2013-08-15 MED ORDER — MORPHINE SULFATE 4 MG/ML IJ SOLN
4.0000 mg | Freq: Once | INTRAMUSCULAR | Status: AC
  Administered 2013-08-15: 4 mg via INTRAVENOUS
  Filled 2013-08-15: qty 1

## 2013-08-15 MED ORDER — OXYCODONE-ACETAMINOPHEN 5-325 MG PO TABS: 1.0000 | ORAL_TABLET | ORAL | Status: DC | PRN

## 2013-08-15 MED ORDER — ONDANSETRON HCL 4 MG/2ML IJ SOLN
4.0000 mg | Freq: Once | INTRAMUSCULAR | Status: AC
  Administered 2013-08-15: 4 mg via INTRAVENOUS
  Filled 2013-08-15: qty 2

## 2013-08-15 NOTE — ED Notes (Signed)
Pt drinking oral contrast,

## 2013-08-15 NOTE — ED Notes (Signed)
Pt c/o upper abd area burning when she wakes up for the past two mornings, abd cramping that starts around umbilicus area with nausea and diarrhea for two days as well, pt denies any urinary symptoms,

## 2013-08-15 NOTE — ED Notes (Signed)
PT reports has been waking up with burning in upper abd and nausea x 1 week.  Reports diarrhea x 2 days, denies vomiting.

## 2013-08-15 NOTE — ED Notes (Signed)
Pt ambulatory to restroom, tolerated well,  

## 2013-08-15 NOTE — ED Provider Notes (Signed)
This chart was scribed for Cynthia Maw Reilynn Lauro, DO by Dorothey Baseman, ED Scribe. This patient was seen in room APA11/APA11 and the patient's care was started at 9:12 AM.   TIME SEEN: 9:12AM  CHIEF COMPLAINT: abdominal pain  HPI:  HPI Comments: Cynthia Hood is a 27 y.o. female who presents to the Emergency Department complaining of a burning abdominal pain onset 1 week ago that began as periumbilical and is now localized to the RLQ with severe, intermittent associated cramping and burning with onset 3-4 days ago that lasts 5-10 seconds. She reports that the pain is exacerbated after eating. She reports associated right lower corner, nausea, diarrhea (3 episodes yesterday, 1 episode this morning). She denies fever, melena, vomiting, hematuria, or dysuria. Patient does not report any aggravating or alleviating symptoms. Patient denies history of abdominal surgeries or any other pertinent medical conditions. Last menstrual period was September 5.  PCP in [place]   ROS: See HPI Constitutional: no fever  Eyes: no drainage  ENT: no runny nose   Cardiovascular:  no chest pain  Resp: no SOB  GI: no vomiting, yes diarrhea, yes nausea   GU: no dysuria, no hematuria  Integumentary: no rash  Allergy: no hives  Musculoskeletal: no leg swelling  Neurological: no slurred speech ROS otherwise negative  PAST MEDICAL HISTORY/PAST SURGICAL HISTORY:  Past Medical History  Diagnosis Date  . Ectopic pregnancy     MEDICATIONS:  Prior to Admission medications   Medication Sig Start Date End Date Taking? Authorizing Provider  cyclobenzaprine (FLEXERIL) 10 MG tablet Take 1 tablet (10 mg total) by mouth 3 (three) times daily as needed for muscle spasms. 05/30/13   Doree Albee, MD  predniSONE (DELTASONE) 50 MG tablet 1 tab daily x 7 days 05/30/13   Doree Albee, MD    ALLERGIES:  No Known Allergies  SOCIAL HISTORY:  History  Substance Use Topics  . Smoking status: Current Every Day Smoker -- 0.50 packs/day  for 12 years    Types: Cigarettes  . Smokeless tobacco: Not on file  . Alcohol Use: Yes     Comment: occ    FAMILY HISTORY: Family History  Problem Relation Age of Onset  . Cancer Other   . Diabetes Father   . Hypertension Father   . Heart disease Father     EXAM:  Triage Vitals: BP 123/77  Pulse 104  Temp(Src) 98.6 F (37 C) (Oral)  Resp 20  SpO2 96%  LMP 07/28/2013  CONSTITUTIONAL: Alert and oriented and responds appropriately to questions. Well-appearing; well-nourished HEAD: Normocephalic EYES: Conjunctivae clear, PERRL ENT: normal nose; no rhinorrhea; moist mucous membranes; pharynx without lesions noted NECK: Supple, no meningismus, no LAD  CARD: RRR; S1 and S2 appreciated; no murmurs, no clicks, no rubs, no gallops RESP: Normal chest excursion without splinting or tachypnea; breath sounds clear and equal bilaterally; no wheezes, no rhonchi, no rales,  ABD/GI: Normal bowel sounds; non-distended; soft, tenderness to palpation over McBurney's point with involuntary guarding, no rebound or peritoneal signs. BACK:  The back appears normal and is non-tender to palpation, there is no CVA tenderness EXT: Normal ROM in all joints; non-tender to palpation; no edema; normal capillary refill; no cyanosis    SKIN: Normal color for age and race; warm NEURO: Moves all extremities equally PSYCH: The patient's mood and manner are appropriate. Grooming and personal hygiene are appropriate.  MEDICAL DECISION MAKING: 9:15AM- Patient with tenderness to palpation at McBurney's point. Discussed concern for appendicitis. Will order a CT  scan, blood work, pain and anti-nausea medications. Discussed treatment plan with patient at bedside and patient verbalized agreement.    ED PROGRESS: CT scan shows colitis.  Patient has no history of ulcerative colitis. Family history of inflammatory bowel disease. No appendicitis.  Pt also has possible ruptured ovarian cyst.  Will dc home with return  precautions, pain meds, abx.  Pt verbalized understanding and is comfortable with plan.  I personally performed the services described in this documentation, which was scribed in my presence. The recorded information has been reviewed and is accurate.   Cynthia Maw Shevon Sian, DO 08/15/13 1623

## 2013-08-17 ENCOUNTER — Ambulatory Visit (INDEPENDENT_AMBULATORY_CARE_PROVIDER_SITE_OTHER): Payer: Medicaid Other | Admitting: Obstetrics & Gynecology

## 2013-08-17 ENCOUNTER — Encounter: Payer: Self-pay | Admitting: Obstetrics & Gynecology

## 2013-08-17 VITALS — BP 90/60 | Ht 61.0 in | Wt 155.0 lb

## 2013-08-17 DIAGNOSIS — N96 Recurrent pregnancy loss: Secondary | ICD-10-CM

## 2013-08-17 DIAGNOSIS — O09291 Supervision of pregnancy with other poor reproductive or obstetric history, first trimester: Secondary | ICD-10-CM

## 2013-08-17 MED ORDER — CLOMIPHENE CITRATE 50 MG PO TABS: 50.0000 mg | ORAL_TABLET | Freq: Every day | ORAL | Status: DC

## 2013-08-17 MED ORDER — PROGESTERONE MICRONIZED 200 MG PO CAPS: 200.0000 mg | ORAL_CAPSULE | Freq: Every day | ORAL | Status: DC

## 2013-08-17 NOTE — Progress Notes (Signed)
Patient ID: BAYAN HEDSTROM, female   DOB: [birth year], 27 y.o.   MRN: [id number] SHANAYA SCHNECK Jan 25, 1986 [id number] percent today to have a discussion her regarding recurrent miscarriage  I've gone over each one of the pregnancies with the patient  All of them were quite early and none of them had any ultrasound evidence of an intrauterine pregnancy  With her first pregnancy she was given methotrexate because it was a probable ectopic pregnancy, this was never confirmed with sonogram  Her 4 pregnancies have been by father a, father be father C. And father C. Which eliminates some recurrent genetic issues as a source  For now I will soon early luteal phase defect and treat her with Clomid and Prometrium  She is given instructions on how to take that And we'll come in and the same as she misses a cycle on the colon  She understands that it may be much more complicated than this but this is a reasonable place to start and if we maximize her progesterone and she still has a early pregnancy loss we will look for more esoteric causes,

## 2013-10-26 ENCOUNTER — Encounter (HOSPITAL_COMMUNITY)
Admission: EM | Disposition: A | Discharge status: Home / Self Care | Payer: Self-pay | Source: Home / Self Care | Attending: Emergency Medicine

## 2013-10-26 ENCOUNTER — Emergency Department (HOSPITAL_COMMUNITY): Payer: Medicaid Other

## 2013-10-26 ENCOUNTER — Emergency Department (HOSPITAL_COMMUNITY)
Admission: EM | Admit: 2013-10-26 | Discharge: 2013-10-26 | Disposition: A | Discharge status: Home / Self Care | Payer: Medicaid Other | Attending: Emergency Medicine | Admitting: Emergency Medicine

## 2013-10-26 ENCOUNTER — Encounter (HOSPITAL_COMMUNITY): Payer: Self-pay | Admitting: Emergency Medicine

## 2013-10-26 DIAGNOSIS — F17200 Nicotine dependence, unspecified, uncomplicated: Secondary | ICD-10-CM | POA: Insufficient documentation

## 2013-10-26 DIAGNOSIS — M549 Dorsalgia, unspecified: Secondary | ICD-10-CM | POA: Insufficient documentation

## 2013-10-26 DIAGNOSIS — R079 Chest pain, unspecified: Secondary | ICD-10-CM

## 2013-10-26 DIAGNOSIS — Z8719 Personal history of other diseases of the digestive system: Secondary | ICD-10-CM | POA: Insufficient documentation

## 2013-10-26 DIAGNOSIS — Z8742 Personal history of other diseases of the female genital tract: Secondary | ICD-10-CM | POA: Insufficient documentation

## 2013-10-26 DIAGNOSIS — Z3202 Encounter for pregnancy test, result negative: Secondary | ICD-10-CM | POA: Insufficient documentation

## 2013-10-26 HISTORY — DX: Ulcerative colitis, unspecified, without complications: K51.90

## 2013-10-26 LAB — D-DIMER, QUANTITATIVE (NOT AT ARMC): D-Dimer, Quant: 0.77 ug/mL-FEU — ABNORMAL HIGH (ref 0.00–0.48)

## 2013-10-26 SURGERY — EGD (ESOPHAGOGASTRODUODENOSCOPY)
Anesthesia: Moderate Sedation

## 2013-10-26 MED ORDER — IOHEXOL 350 MG/ML SOLN
100.0000 mL | Freq: Once | INTRAVENOUS | Status: AC | PRN
  Administered 2013-10-26: 100 mL via INTRAVENOUS

## 2013-10-26 MED ORDER — IBUPROFEN 600 MG PO TABS: 600.0000 mg | ORAL_TABLET | Freq: Four times a day (QID) | ORAL | Status: DC | PRN

## 2013-10-26 MED ORDER — KETOROLAC TROMETHAMINE 30 MG/ML IJ SOLN
30.0000 mg | Freq: Once | INTRAMUSCULAR | Status: AC
  Administered 2013-10-26: 30 mg via INTRAMUSCULAR
  Filled 2013-10-26: qty 1

## 2013-10-26 NOTE — Discharge Instructions (Signed)
Chest Pain (Nonspecific) °It is often hard to give a specific diagnosis for the cause of chest pain. There is always a chance that your pain could be related to something serious, such as a heart attack or a blood clot in the lungs. You need to follow up with your caregiver for further evaluation. °CAUSES  °· Heartburn. °· Pneumonia or bronchitis. °· Anxiety or stress. °· Inflammation around your heart (pericarditis) or lung (pleuritis or pleurisy). °· A blood clot in the lung. °· A collapsed lung (pneumothorax). It can develop suddenly on its own (spontaneous pneumothorax) or from injury (trauma) to the chest. °· Shingles infection (herpes zoster virus). °The chest wall is composed of bones, muscles, and cartilage. Any of these can be the source of the pain. °· The bones can be bruised by injury. °· The muscles or cartilage can be strained by coughing or overwork. °· The cartilage can be affected by inflammation and become sore (costochondritis). °DIAGNOSIS  °Lab tests or other studies, such as X-rays, electrocardiography, stress testing, or cardiac imaging, may be needed to find the cause of your pain.  °TREATMENT  °· Treatment depends on what may be causing your chest pain. Treatment may include: °· Acid blockers for heartburn. °· Anti-inflammatory medicine. °· Pain medicine for inflammatory conditions. °· Antibiotics if an infection is present. °· You may be advised to change lifestyle habits. This includes stopping smoking and avoiding alcohol, caffeine, and chocolate. °· You may be advised to keep your head raised (elevated) when sleeping. This reduces the chance of acid going backward from your stomach into your esophagus. °· Most of the time, nonspecific chest pain will improve within 2 to 3 days with rest and mild pain medicine. °HOME CARE INSTRUCTIONS  °· If antibiotics were prescribed, take your antibiotics as directed. Finish them even if you start to feel better. °· For the next few days, avoid physical  activities that bring on chest pain. Continue physical activities as directed. °· Do not smoke. °· Avoid drinking alcohol. °· Only take over-the-counter or prescription medicine for pain, discomfort, or fever as directed by your caregiver. °· Follow your caregiver's suggestions for further testing if your chest pain does not go away. °· Keep any follow-up appointments you made. If you do not go to an appointment, you could develop lasting (chronic) problems with pain. If there is any problem keeping an appointment, you must call to reschedule. °SEEK MEDICAL CARE IF:  °· You think you are having problems from the medicine you are taking. Read your medicine instructions carefully. °· Your chest pain does not go away, even after treatment. °· You develop a rash with blisters on your chest. °SEEK IMMEDIATE MEDICAL CARE IF:  °· You have increased chest pain or pain that spreads to your arm, neck, jaw, back, or abdomen. °· You develop shortness of breath, an increasing cough, or you are coughing up blood. °· You have severe back or abdominal pain, feel nauseous, or vomit. °· You develop severe weakness, fainting, or chills. °· You have a fever. °THIS IS AN EMERGENCY. Do not wait to see if the pain will go away. Get medical help at once. Call your local emergency services (911 in [place] Do not drive yourself to the hospital. °MAKE SURE YOU:  °· Understand these instructions. °· Will watch your condition. °· Will get help right away if you are not doing well or get worse. °Document Released: 08/19/2005 Document Revised: 02/01/2012 Document Reviewed: 06/14/2008 °Exit[employer]® Patient Information ©2014 [employer]   [employer]. ° °

## 2013-10-26 NOTE — ED Notes (Signed)
Pt reporting pain on left side of chest and in her back.  Reports pain began about 2 hours ago.  Denies nausea, vomiting or SOB.

## 2013-10-26 NOTE — ED Notes (Signed)
nad noted prior to dc. Dc instructions reviewed and explained. 1 script given to pt an voiced understanding. Ambulated out without difficulty.

## 2013-10-26 NOTE — ED Provider Notes (Signed)
CSN: [id number]     Arrival date & time 10/26/13  4010 History  This chart was scribed for Cynthia Baton, MD by Quintella Reichert, ED scribe.  This patient was seen in room APA04/APA04 and the patient's care was started at 7:11 AM.   Chief Complaint  Patient presents with  . Chest Pain    The history is provided by the patient. No language interpreter was used.    HPI Comments: Cynthia Hood is a 27 y.o. female who presents to the Emergency Department complaining of left-sided chest pain that began 2 hours ago while pt was urinating before work this morning.  Pain is localized to the left side and is described as sharp and "tightening."  It radiates to the left side of her back and her left shoulder and currently she states the pain is more severe in her back than in her chest.  Pain is worsened by movement.  She has not attempted to treat pain pta.  She denies SOB, fevers, abdominal pain, nausea, vomiting, or any other associated symptoms.  She denies injury to the chest.  She denies recent long travel, surgery or immobilization, hormone use, or h/o DVT/PE.  She did not eat this morning before onset of pain.  Pt notes that 1 1/2 years ago she had similar pain and was seen in the ED but does not remember her diagnosis from that visit.  She denies chronic medical conditions or regular medication usage.  She denies medication allergies.  She is a current every-day smoker.  She works as a Financial risk analyst.   Past Medical History  Diagnosis Date  . Ectopic pregnancy   . Colitis, ulcerative     Past Surgical History  Procedure Laterality Date  . No past surgeries      Family History  Problem Relation Age of Onset  . Cancer Other   . Diabetes Father   . Hypertension Father   . Heart disease Father     History  Substance Use Topics  . Smoking status: Current Every Day Smoker -- 0.50 packs/day for 12 years    Types: Cigarettes  . Smokeless tobacco: Not on file  . Alcohol Use: Yes     Comment:  occ    OB History   Grav Para Term Preterm Abortions TAB SAB Ect Mult Living   4    3           Review of Systems  Constitutional: Negative for fever.  Respiratory: Negative for shortness of breath.   Cardiovascular: Positive for chest pain. Negative for leg swelling.  Gastrointestinal: Negative for nausea, vomiting and abdominal pain.  Musculoskeletal: Positive for back pain.  All other systems reviewed and are negative.     Allergies  Review of patient's allergies indicates no known allergies.  Home Medications   Current Outpatient Rx  Name  Route  Sig  Dispense  Refill  . ibuprofen (ADVIL,MOTRIN) 600 MG tablet   Oral   Take 1 tablet (600 mg total) by mouth every 6 (six) hours as needed.   30 tablet   0    BP 120/67  Pulse 106  Temp(Src) 98.4 F (36.9 C) (Oral)  Resp 18  Ht 5\' 2"  (1.575 m)  Wt 155 lb (70.308 kg)  BMI 28.34 kg/m2  SpO2 95%  LMP 10/25/2013  Physical Exam  Nursing note and vitals reviewed. Constitutional: She is oriented to person, place, and time. She appears well-developed and well-nourished. No distress.  HENT:  Head: Normocephalic and atraumatic.  Eyes: Pupils are equal, round, and reactive to light.  Neck: Neck supple.  Cardiovascular: Normal rate, regular rhythm and normal heart sounds.   No murmur heard. Pulmonary/Chest: Effort normal and breath sounds normal. No respiratory distress. She has no wheezes. She exhibits no tenderness.  Abdominal: Soft. There is no tenderness.  Musculoskeletal: She exhibits no edema.  Neurological: She is alert and oriented to person, place, and time.  Skin: Skin is warm and dry.  Psychiatric: She has a normal mood and affect.    ED Course  Procedures (including critical care time)  DIAGNOSTIC STUDIES: Oxygen Saturation is 95% on room air, adequate by my interpretation.    COORDINATION OF CARE: 7:15 AM-Discussed treatment plan which includes EKG, Toradol, CXR and labs with pt at bedside and pt  agreed to plan.    Labs Review Labs Reviewed  D-DIMER, QUANTITATIVE - Abnormal; Notable for the following:    D-Dimer, Quant 0.77 (*)    All other components within normal limits  CREATININE, SERUM  POCT PREGNANCY, URINE    Imaging Review Dg Chest 2 View  10/26/2013   CLINICAL DATA:  Chest pain  EXAM: CHEST  2 VIEW  COMPARISON:  05/20/2013  FINDINGS: The heart size and mediastinal contours are within normal limits. Both lungs are clear. The visualized skeletal structures are unremarkable.  IMPRESSION: No active cardiopulmonary disease.   Electronically Signed   By: Marlan Palau M.D.   On: 10/26/2013 08:20   Ct Angio Chest W/cm &/or Wo Cm  10/26/2013   CLINICAL DATA:  Chest pain and shortness of breath  EXAM: CT ANGIOGRAPHY CHEST WITH CONTRAST  TECHNIQUE: Multidetector CT imaging of the chest was performed using the standard protocol during bolus administration of intravenous contrast. Multiplanar CT image reconstructions including MIPs were obtained to evaluate the vascular anatomy.  CONTRAST:  OMNIPAQUE IOHEXOL 350 MG/ML SOLN  COMPARISON:  Chest radiograph October 26, 2013 and chest CT June 28, 2012  FINDINGS: There is no demonstrable pulmonary embolus. There is no evidence of thoracic aortic aneurysm or dissection.  There is mild atelectasis in the medial right base anteriorly. Lungs are otherwise clear. There is no appreciable thoracic adenopathy. The pericardium is not thickened.  Visualized upper abdominal structures appear normal. There are no blastic or lytic bone lesions.  Review of the MIP images confirms the above findings.  IMPRESSION: No demonstrable pulmonary embolus. Mild atelectasis anterior medial right base. Lungs otherwise clear.   Electronically Signed   By: Bretta Bang M.D.   On: 10/26/2013 10:30    EKG Interpretation   None      EKG evidently reviewed by myself: Sinus arrhythmia, otherwise normal EKG  Medications  ketorolac (TORADOL) 30 MG/ML injection  30 mg (30 mg Intramuscular Given 10/26/13 0723)  iohexol (OMNIPAQUE) 350 MG/ML injection 100 mL (100 mLs Intravenous Contrast Given 10/26/13 1010)    MDM   1. Chest pain     Patient presents with chest pain.  She is nontoxic-appearing on exam. She does not have any reproducible chest pain at this time. Initial vital signs noted for a heart rate of 102. She is otherwise low risk for PE. D-dimer was used to screened and is positive. Patient will be sent for CT scan. EKG is reassuring. Patient was given Toradol with some improvement of her pain.  CT scan is negative for PE. At this time the patient can be discharged home. Pain is likely related to musculoskeletal etiology or costochondritis.  She is to followup with her primary care physician.  I personally performed the services described in this documentation, which was scribed in my presence. The recorded information has been reviewed and is accurate.    Cynthia Baton, MD 10/26/13 [phone]

## 2013-11-07 IMAGING — CR DG ABDOMEN ACUTE W/ 1V CHEST
3 series · 3 of 3 positions shown · non-contrast
Comparison: CT of 06/28/2012 chest.  No prior abdominal imaging.

CLINICAL DATA: Pain in left lower quadrant.

ACUTE ABDOMEN SERIES (ABDOMEN 2 VIEW & CHEST 1 VIEW)

[view not recorded (1 of 3)]
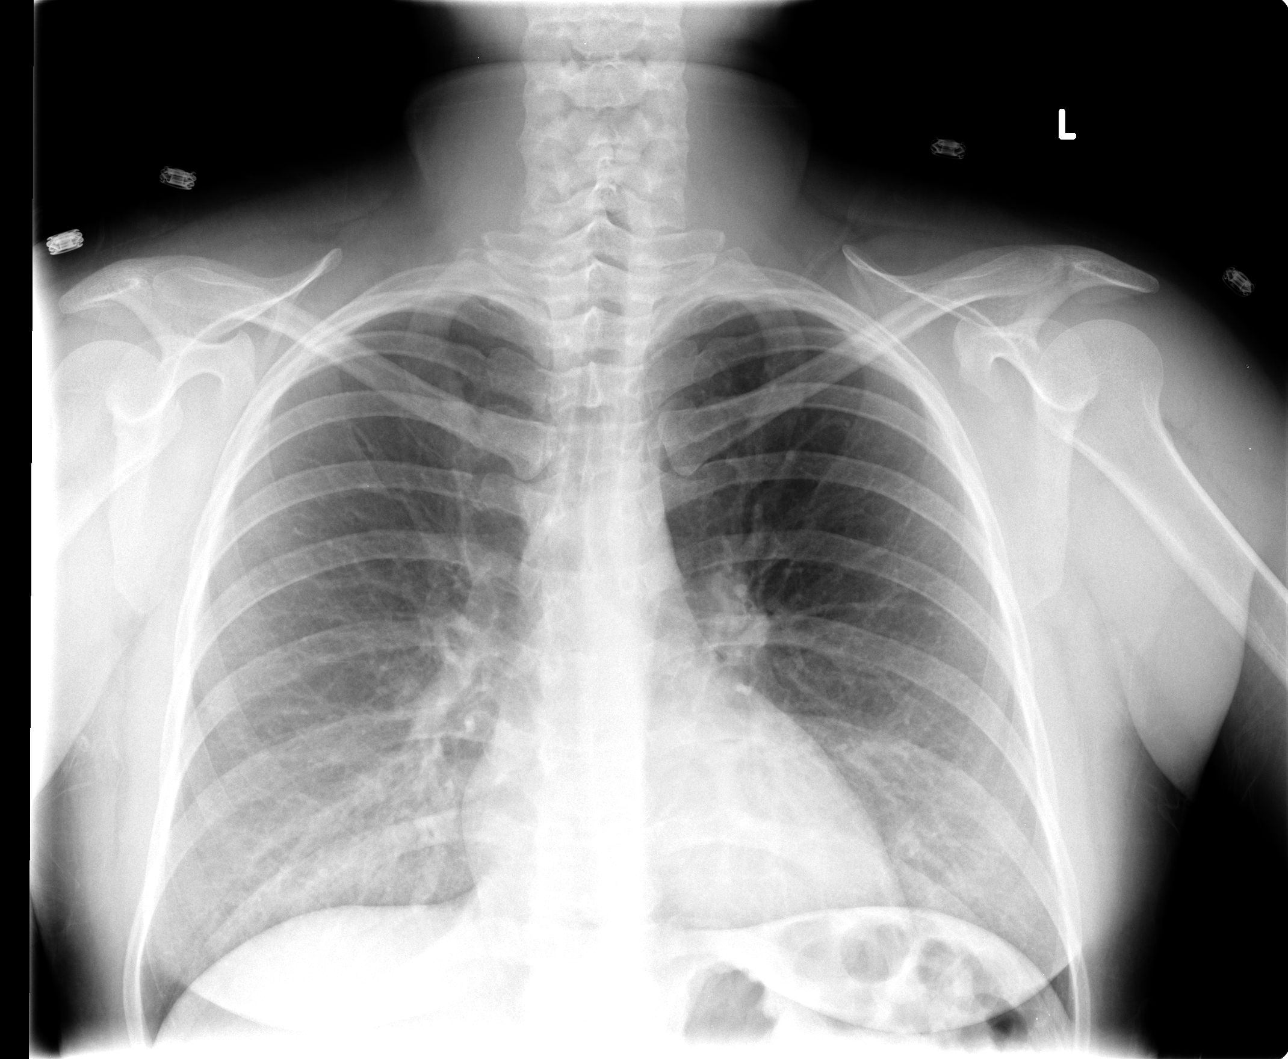

[view not recorded (2 of 3)]
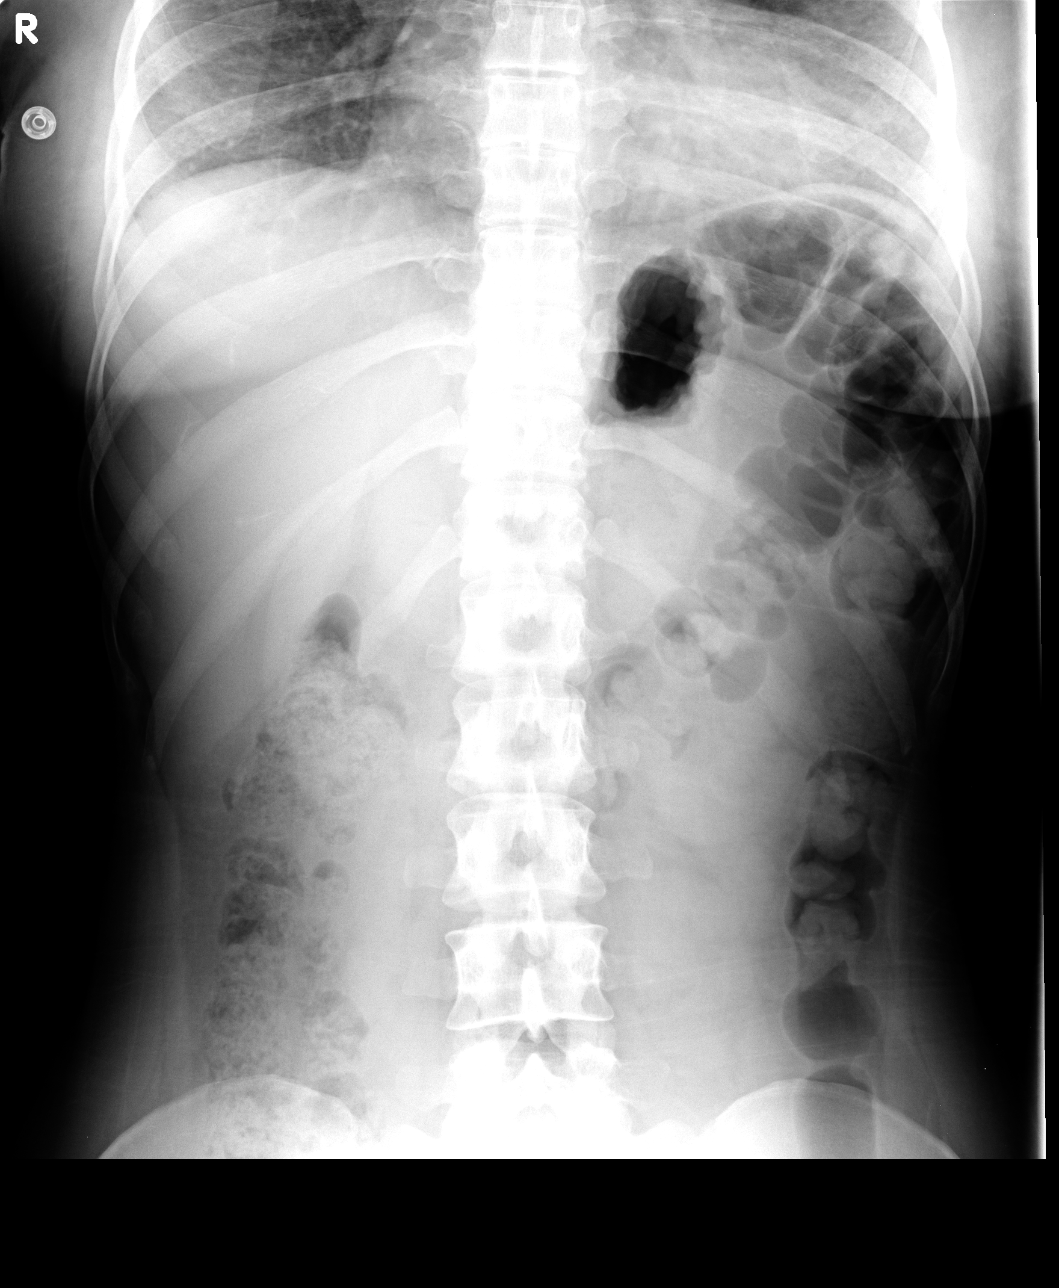

[view not recorded (3 of 3)]
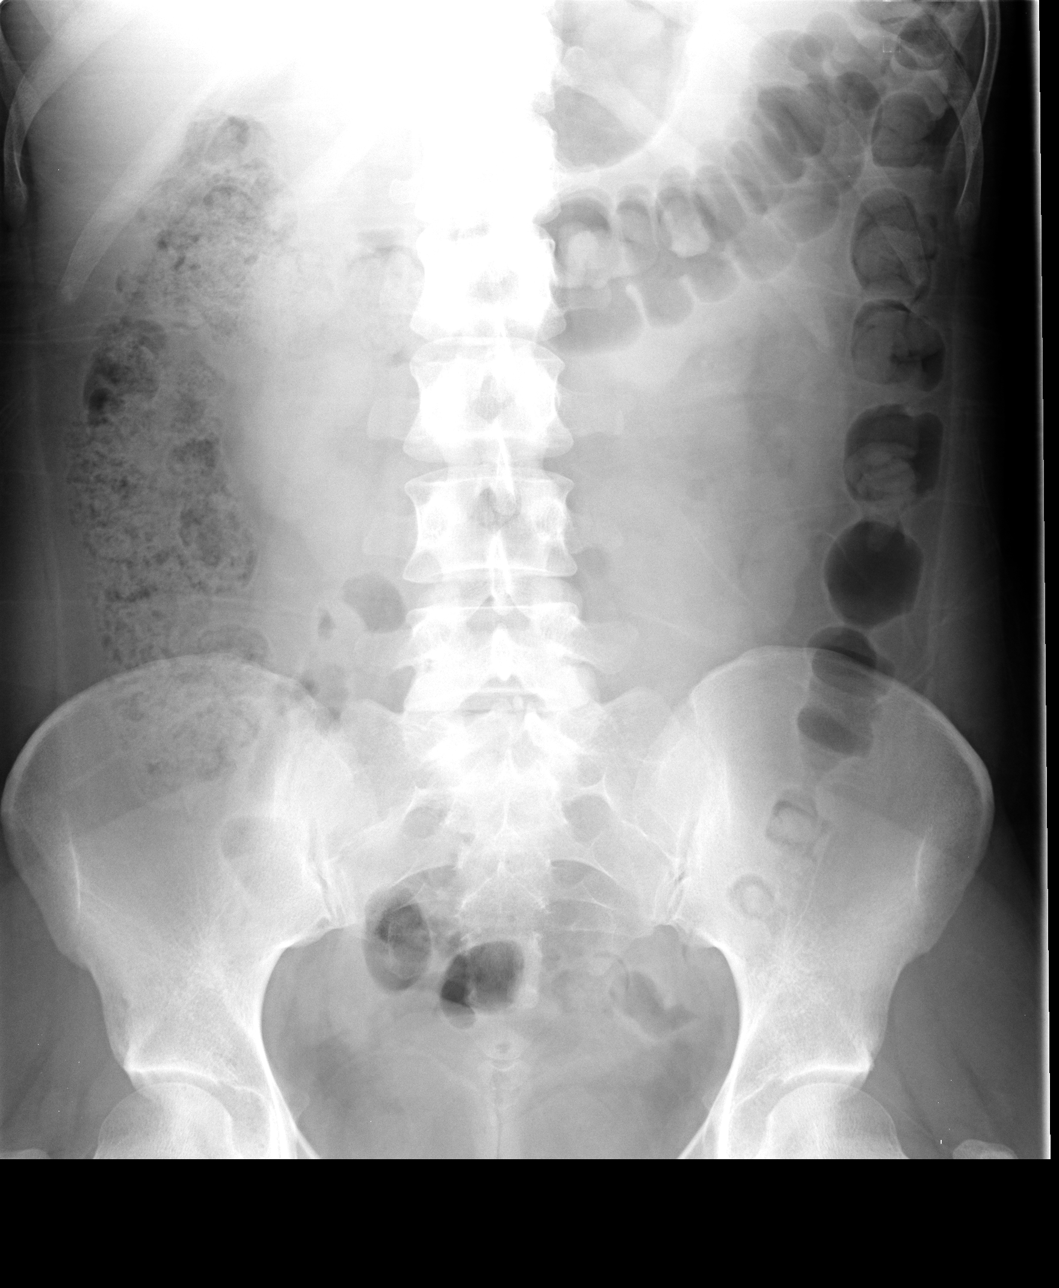

[3 of 3 positions shown; findings below may reference images not displayed]

FINDINGS: Frontal view of the chest demonstrates midline trachea.
Normal heart size and mediastinal contours. No pleural effusion or
pneumothorax.  Mild lower lobe predominant interstitial thickening
is suspected. No lobar consolidation.

Abominal films demonstrate no free intraperitoneal air or
significant air fluid levels on upright positioning.  Moderate
amount of ascending colonic stool.  No small bowel dilatation on
supine imaging. Distal gas and stool.  No abnormal abdominal
calcifications.   No appendicolith.
IMPRESSION: No acute abdominal findings.  Question constipation.

Lower lobe predominant interstitial thickening is likely the
sequelae of chronic bronchitis/smoking.

## 2013-11-23 NOTE — L&D Delivery Note (Signed)
Delivery Note At 8:37 PM a viable female was delivered via Vaginal, Spontaneous Delivery (Presentation: Right Occiput Anterior).  APGAR: 9, 9; weight pending.   Placenta status: Intact, Spontaneous.  Cord: 2 vessels with the following complications: None.    Anesthesia: Epidural  Episiotomy: None Lacerations: None Suture Repair: None Est. Blood Loss (mL): 300  Mom to postpartum.  Baby to Couplet care / Skin to Skin.  Shelbie Hutching 07/23/2014, 9:02 PM

## 2013-11-27 ENCOUNTER — Encounter (INDEPENDENT_AMBULATORY_CARE_PROVIDER_SITE_OTHER): Payer: Self-pay

## 2013-11-27 ENCOUNTER — Encounter: Payer: Self-pay | Admitting: Adult Health

## 2013-11-27 ENCOUNTER — Ambulatory Visit (INDEPENDENT_AMBULATORY_CARE_PROVIDER_SITE_OTHER): Payer: Medicaid Other | Admitting: Adult Health

## 2013-11-27 VITALS — BP 100/66 | Ht 62.0 in | Wt 166.0 lb

## 2013-11-27 DIAGNOSIS — Z3201 Encounter for pregnancy test, result positive: Secondary | ICD-10-CM

## 2013-11-27 DIAGNOSIS — Z3200 Encounter for pregnancy test, result unknown: Secondary | ICD-10-CM

## 2013-11-27 LAB — POCT URINE PREGNANCY: Preg Test, Ur: POSITIVE

## 2013-11-27 NOTE — Addendum Note (Signed)
Addended by: Farley Ly on: 11/27/2013 01:44 PM   Modules accepted: Orders

## 2013-11-28 ENCOUNTER — Telehealth: Payer: Self-pay | Admitting: Adult Health

## 2013-11-28 LAB — PROGESTERONE: Progesterone: 17.5 ng/mL

## 2013-11-28 LAB — HCG, QUANTITATIVE, PREGNANCY: HCG, BETA CHAIN, QUANT, S: 555.9 m[IU]/mL

## 2013-11-28 NOTE — Telephone Encounter (Signed)
Pt aware of labs and she has appt 1/15

## 2013-11-28 NOTE — Telephone Encounter (Signed)
No let's do a sonogram in 2 weeks, based on the levels The progesterone is excellent

## 2013-11-28 NOTE — Telephone Encounter (Signed)
Pt informed of 555.9 QHCG and 17.5 Progesterone level.

## 2013-12-01 ENCOUNTER — Other Ambulatory Visit: Payer: Self-pay | Admitting: Obstetrics & Gynecology

## 2013-12-01 DIAGNOSIS — O3680X0 Pregnancy with inconclusive fetal viability, not applicable or unspecified: Secondary | ICD-10-CM

## 2013-12-07 ENCOUNTER — Ambulatory Visit (INDEPENDENT_AMBULATORY_CARE_PROVIDER_SITE_OTHER): Payer: Medicaid Other

## 2013-12-07 ENCOUNTER — Encounter: Payer: Self-pay | Admitting: Obstetrics & Gynecology

## 2013-12-07 ENCOUNTER — Other Ambulatory Visit: Payer: Self-pay | Admitting: Obstetrics & Gynecology

## 2013-12-07 DIAGNOSIS — O09299 Supervision of pregnancy with other poor reproductive or obstetric history, unspecified trimester: Secondary | ICD-10-CM

## 2013-12-07 DIAGNOSIS — O2620 Pregnancy care for patient with recurrent pregnancy loss, unspecified trimester: Secondary | ICD-10-CM

## 2013-12-07 DIAGNOSIS — O3680X0 Pregnancy with inconclusive fetal viability, not applicable or unspecified: Secondary | ICD-10-CM

## 2013-12-07 NOTE — Progress Notes (Signed)
U/S(6+1wks)-transvaginal u/s performed, single IUP with +FCA noted, FHR- 114 bpm, cx appears long and closed, bilateral adnexa/ovaries appears wnl, CRL c/w LMP dates

## 2013-12-09 ENCOUNTER — Emergency Department (HOSPITAL_COMMUNITY)
Admission: EM | Admit: 2013-12-09 | Discharge: 2013-12-09 | Disposition: A | Discharge status: Home / Self Care | Payer: Medicaid Other | Attending: Emergency Medicine | Admitting: Emergency Medicine

## 2013-12-09 ENCOUNTER — Encounter (HOSPITAL_COMMUNITY): Payer: Self-pay | Admitting: Emergency Medicine

## 2013-12-09 ENCOUNTER — Emergency Department (HOSPITAL_COMMUNITY): Payer: Medicaid Other

## 2013-12-09 DIAGNOSIS — Z8719 Personal history of other diseases of the digestive system: Secondary | ICD-10-CM | POA: Insufficient documentation

## 2013-12-09 DIAGNOSIS — O209 Hemorrhage in early pregnancy, unspecified: Secondary | ICD-10-CM | POA: Insufficient documentation

## 2013-12-09 DIAGNOSIS — Z7982 Long term (current) use of aspirin: Secondary | ICD-10-CM | POA: Insufficient documentation

## 2013-12-09 DIAGNOSIS — F17200 Nicotine dependence, unspecified, uncomplicated: Secondary | ICD-10-CM | POA: Insufficient documentation

## 2013-12-09 DIAGNOSIS — O4690 Antepartum hemorrhage, unspecified, unspecified trimester: Secondary | ICD-10-CM

## 2013-12-09 LAB — HCG, QUANTITATIVE, PREGNANCY: hCG, Beta Chain, Quant, S: 14233 m[IU]/mL — ABNORMAL HIGH (ref <5)

## 2013-12-09 NOTE — ED Provider Notes (Signed)
Medical screening examination/treatment/procedure(s) were performed by non-physician practitioner and as supervising physician I was immediately available for consultation/collaboration.  EKG Interpretation   None        Nat Christen, MD 12/09/13 1434

## 2013-12-09 NOTE — ED Notes (Signed)
Pt in [place]

## 2013-12-09 NOTE — ED Notes (Signed)
[redacted] weeks pregnant; reports hx of 5 pregnancies, including this one, that have ended in miscarriage.  States noticed brown vaginal discharge this morning.  Pt is not wearing a pad.  The discharge is only noticed when she wipes.  Reports abdominal cramping last night, but denies pain at present.

## 2013-12-09 NOTE — Discharge Instructions (Signed)
° °Pelvic Rest °Pelvic rest is sometimes recommended for women when:  °· The placenta is partially or completely covering the opening of the cervix (placenta previa). °· There is bleeding between the uterine wall and the amniotic sac in the first trimester (subchorionic hemorrhage). °· The cervix begins to open without labor starting (incompetent cervix, cervical insufficiency). °· The labor is too early (preterm labor). °HOME CARE INSTRUCTIONS °· Do not have sexual intercourse, stimulation, or an orgasm. °· Do not use tampons, douche, or put anything in the vagina. °· Do not lift anything over 10 pounds (4.5 kg). °· Avoid strenuous activity or straining your pelvic muscles. °SEEK MEDICAL CARE IF:  °· You have any vaginal bleeding during pregnancy. Treat this as a potential emergency. °· You have cramping pain felt low in the stomach (stronger than menstrual cramps). °· You notice vaginal discharge (watery, mucus, or bloody). °· You have a low, dull backache. °· There are regular contractions or uterine tightening. °SEEK IMMEDIATE MEDICAL CARE IF: °You have vaginal bleeding and have placenta previa.  °Document Released: 03/06/2011 Document Revised: 02/01/2012 Document Reviewed: 03/06/2011 °ExitCare® Patient Information ©2014 [employer]. ° °Vaginal Bleeding During Pregnancy, First Trimester °A small amount of bleeding (spotting) from the vagina is relatively common in early pregnancy. It usually stops on its own. Various things may cause bleeding or spotting in early pregnancy. Some bleeding may be related to the pregnancy, and some may not. In most cases, the bleeding is normal and is not a problem. However, bleeding can also be a sign of something serious. Be sure to tell your health care provider about any vaginal bleeding right away. °Some possible causes of vaginal bleeding during the first trimester include: °· Infection or inflammation of the cervix. °· Growths (polyps) on the cervix. °· Miscarriage or  threatened miscarriage. °· Pregnancy tissue has developed outside of the uterus and in a fallopian tube (tubal pregnancy). °· Tiny cysts have developed in the uterus instead of pregnancy tissue (molar pregnancy). °HOME CARE INSTRUCTIONS  °Watch your condition for any changes. The following actions may help to lessen any discomfort you are feeling: °· Follow your health care provider's instructions for limiting your activity. If your health care provider orders bed rest, you may need to stay in bed and only get up to use the bathroom. However, your health care provider may allow you to continue light activity. °· If needed, make plans for someone to help with your regular activities and responsibilities while you are on bed rest. °· Keep track of the number of pads you use each day, how often you change pads, and how soaked (saturated) they are. Write this down. °· Do not use tampons. Do not douche. °· Do not have sexual intercourse or orgasms until approved by your health care provider. °· If you pass any tissue from your vagina, save the tissue so you can show it to your health care provider. °· Only take over-the-counter or prescription medicines as directed by your health care provider. °· Do not take aspirin because it can make you bleed. °· Keep all follow-up appointments as directed by your health care provider. °SEEK MEDICAL CARE IF: °· You have any vaginal bleeding during any part of your pregnancy. °· You have cramps or labor pains. °SEEK IMMEDIATE MEDICAL CARE IF:  °· You have severe cramps in your back or belly (abdomen). °· You have a fever, not controlled by medicine. °· You pass large clots or tissue from your vagina. °· Your bleeding   increases. °· You feel lightheaded or weak, or you have fainting episodes. °· You have chills. °· You are leaking fluid or have a gush of fluid from your vagina. °· You pass out while having a bowel movement. °MAKE SURE YOU: °· Understand these instructions. °· Will watch  your condition. °· Will get help right away if you are not doing well or get worse. °Document Released: 08/19/2005 Document Revised: 08/30/2013 Document Reviewed: 07/17/2013 °ExitCare® Patient Information ©2014 [employer]. ° °

## 2013-12-09 NOTE — ED Provider Notes (Signed)
CSN: [id number]     Arrival date & time 12/09/13  2585 History   First MD Initiated Contact with Patient 12/09/13 0845     Chief Complaint  Patient presents with  . Vaginal Bleeding   (Consider location/radiation/quality/duration/timing/severity/associated sxs/prior Treatment) Patient is a 28 y.o. female presenting with vaginal bleeding. The history is provided by the patient. No language interpreter was used.  Vaginal Bleeding Quality:  Spotting Severity:  Mild Onset quality:  Sudden Duration:  1 day Timing:  Constant Chronicity:  New Number of pads used:  0 Number of tampons used:  0 Possible pregnancy: yes   Relieved by:  Nothing Worsened by:  Nothing tried Associated symptoms: no abdominal pain   Risk factors: prior miscarriage   Pt has a history of multiple miscarriages.   Past Medical History  Diagnosis Date  . Ectopic pregnancy   . Colitis, ulcerative    Past Surgical History  Procedure Laterality Date  . No past surgeries     Family History  Problem Relation Age of Onset  . Cancer Other   . Diabetes Father   . Hypertension Father   . Heart disease Father    History  Substance Use Topics  . Smoking status: Light Tobacco Smoker -- 0.50 packs/day for 12 years    Types: Cigarettes  . Smokeless tobacco: Never Used  . Alcohol Use: No     Comment: occ   OB History   Grav Para Term Preterm Abortions TAB SAB Ect Mult Living   5    4  4         Review of Systems  Gastrointestinal: Negative for abdominal pain.  Genitourinary: Positive for vaginal bleeding.  All other systems reviewed and are negative.    Allergies  Review of patient's allergies indicates no known allergies.  Home Medications   Current Outpatient Rx  Name  Route  Sig  Dispense  Refill  . aspirin 81 MG tablet   Oral   Take 81 mg by mouth daily.         . progesterone 200 MG SUPP   Vaginal   Place 200 mg vaginally at bedtime.          BP 112/64  Pulse 100  Temp(Src) 98.3 F  (36.8 C) (Oral)  Resp 18  Wt 166 lb (75.297 kg)  SpO2 99%  LMP 10/25/2013 Physical Exam  Nursing note and vitals reviewed. Constitutional: She is oriented to person, place, and time. She appears well-developed and well-nourished.  HENT:  Head: Normocephalic and atraumatic.  Eyes: EOM are normal.  Neck: Normal range of motion.  Cardiovascular: Normal rate.   Pulmonary/Chest: Effort normal.  Abdominal: Soft. She exhibits no distension.  Musculoskeletal: Normal range of motion.  Neurological: She is alert and oriented to person, place, and time.  Skin: Skin is warm.  Psychiatric: She has a normal mood and affect.    ED Course  Procedures (including critical care time) Labs Review Labs Reviewed  HCG, QUANTITATIVE, PREGNANCY - Abnormal; Notable for the following:    hCG, Beta Chain, Quant, S 14233 (*)    All other components within normal limits   Imaging Review US Ob Transvaginal  12/09/2013   CLINICAL DATA:  Vaginal bleeding. Pelvic pain and cramping. Gestational age by LMP of six weeks 3 days.  EXAM: OBSTETRIC <14 WK [place] AND TRANSVAGINAL OB US  TECHNIQUE: Both transabdominal and transvaginal ultrasound examinations were performed for complete evaluation of the gestation as well as the maternal uterus, adnexal  regions, and pelvic cul-de-sac. Transvaginal technique was performed to assess early pregnancy.  COMPARISON:  None.  FINDINGS: Intrauterine gestational sac: Visualized/normal in shape.  Yolk sac:  Visualized  Embryo:  Visualized  Cardiac Activity: Visualized  Heart Rate:  121 bpm  CRL:   4  mm   6 w 1 d                  [place] EDC: 08/03/2014  Maternal uterus/adnexae: No subchorionic hemorrhage visualized. Both ovaries are normal in appearance. No mass or free fluid identified.  IMPRESSION: Single living IUP measuring 6 weeks 1 day, which is concordant with given LMP.  No significant maternal uterine or adnexal abnormality identified.   Electronically Signed   By: Earle Gell M.D.   On:  12/09/2013 10:29    EKG Interpretation   None       MDM IUP 6w1 day      1. Vaginal bleeding in pregnancy     Pt advised see Dr. Glo Herring for recheck this week.      [place], PA-C 12/09/13 1104

## 2013-12-18 ENCOUNTER — Encounter: Payer: Self-pay | Admitting: Women's Health

## 2013-12-18 ENCOUNTER — Other Ambulatory Visit: Payer: Medicaid Other

## 2013-12-18 ENCOUNTER — Ambulatory Visit (INDEPENDENT_AMBULATORY_CARE_PROVIDER_SITE_OTHER): Payer: Medicaid Other | Admitting: Women's Health

## 2013-12-18 VITALS — BP 116/58 | Temp 98.1°F | Wt 163.5 lb

## 2013-12-18 DIAGNOSIS — Z3480 Encounter for supervision of other normal pregnancy, unspecified trimester: Secondary | ICD-10-CM

## 2013-12-18 DIAGNOSIS — O2620 Pregnancy care for patient with recurrent pregnancy loss, unspecified trimester: Secondary | ICD-10-CM

## 2013-12-18 DIAGNOSIS — O09299 Supervision of pregnancy with other poor reproductive or obstetric history, unspecified trimester: Secondary | ICD-10-CM

## 2013-12-18 DIAGNOSIS — Z331 Pregnant state, incidental: Secondary | ICD-10-CM

## 2013-12-18 DIAGNOSIS — Z1389 Encounter for screening for other disorder: Secondary | ICD-10-CM

## 2013-12-18 LAB — POCT URINALYSIS DIPSTICK
Glucose, UA: NEGATIVE
Ketones, UA: NEGATIVE
Leukocytes, UA: NEGATIVE
Nitrite, UA: NEGATIVE
Protein, UA: NEGATIVE

## 2013-12-18 LAB — CBC
HEMATOCRIT: 40.1 % (ref 36.0–46.0)
Hemoglobin: 13.9 g/dL (ref 12.0–15.0)
MCH: 30.3 pg (ref 26.0–34.0)
MCHC: 34.7 g/dL (ref 30.0–36.0)
MCV: 87.4 fL (ref 78.0–100.0)
PLATELETS: 201 10*3/uL (ref 150–400)
RBC: 4.59 MIL/uL (ref 3.87–5.11)
RDW: 13 % (ref 11.5–15.5)
WBC: 10.5 10*3/uL (ref 4.0–10.5)

## 2013-12-18 MED ORDER — OB COMPLETE 50-1.25 MG PO TABS: 1.0000 | ORAL_TABLET | Freq: Every day | ORAL | Status: DC

## 2013-12-18 NOTE — Progress Notes (Addendum)
Subjective:    Cynthia Hood is a 28 y.o. G79P0040 Caucasian female at [redacted]w[redacted]d by LMP which differs from 6wk u/s by 2d, being seen today for her first obstetrical visit.  Her obstetrical history is significant for 4 SABs one of which was thought to be ectopic. 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 States she went to ED in Dec w/ CP, had an elevated d-dimer and was told she may have some sort of clotting disorder and encouraged to begin asa 81mg  daily, which she has.  She reports stopping the clomid prior to pregnancy/does not feel she conceived on it. Still taking prometrium. Progesterone 17.5 on 11/27/13. Pregnancy history fully reviewed. Reports normal pap at [facility] in [place] ~65yr ago.    Patient reports daily nausea, no vomiting- declines antiemetics. Denies vb, cramping, uti s/s, abnormal/malodorous vag d/c, or vulvovaginal itching/irritation.  Filed Vitals:   12/18/13 1419  BP: 116/58  Temp: 98.1 F (36.7 C)  Weight: 163 lb 8 oz (74.163 kg)    HISTORY: OB History  Gravida Para Term Preterm AB SAB TAB Ectopic Multiple Living  5    4 4         # Outcome Date GA Lbr Len/2nd Weight Sex Delivery Anes PTL Lv  5 CUR           4 SAB 2014          3 SAB 2014          2 SAB 2011          1 SAB 2007             Past Medical History  Diagnosis Date  . Ectopic pregnancy   . Colitis, ulcerative    Past Surgical History  Procedure Laterality Date  . No past surgeries     Family History  Problem Relation Age of Onset  . Cancer Other   . Diabetes Father   . Hypertension Father   . Heart disease Father      Exam   System:     Skin: normal coloration and turgor, no rashes    Neurologic: oriented, normal mood   Extremities: normal strength, tone, and muscle mass   HEENT PERRLA   Mouth/Teeth mucous membranes moist   Cardiovascular: regular rate and rhythm   Respiratory:   appears well, vitals normal, no respiratory distress, acyanotic, normal RR   Abdomen: soft, non-tender    Thin prep pap smear not obtained FHR: 166 via informal u/s   Assessment:    Pregnancy: G5P0040 Patient Active Problem List   Diagnosis Date Noted  . Supervision of other normal pregnancy 12/18/2013    Priority: High  . 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 08/17/2013      [redacted]w[redacted]d G5P0040 New OB visit H/O recurrent SABs H/O ectopic x 1 Nausea w/o vomiting Possible clotting disorder, taking asa daily    Plan:     Initial labs drawn Continue prenatal vitamins Problem list reviewed and updated Reviewed n/v relief measures and warning s/s to report Reviewed recommended weight gain based on pre-gravid BMI Encouraged well-balanced diet Genetic Screening discussed Integrated Screen: declined Cystic fibrosis screening discussed declined Ultrasound discussed; fetal survey: requested Follow up in 4 weeks for visit Discussed possible clotting disorder w/ LHE, no need to test d/t being on asa therapy, OK to continue asa 81mg  during  pregnancy OK to continue prometrium until 14wks To call us if she decides she would like antiemetic Recommended flu shot, states she will think about it and let us know Request pap results from [place] 12/18/2013 3:17 PM

## 2013-12-18 NOTE — Patient Instructions (Signed)
Nausea & Vomiting  Have saltine crackers or pretzels by your bed and eat a few bites before you raise your head out of bed in the morning  Eat small frequent meals throughout the day instead of large meals  Drink plenty of fluids throughout the day to stay hydrated, just don't drink a lot of fluids with your meals.  This can make your stomach fill up faster making you feel sick  Do not brush your teeth right after you eat  Products with real ginger are good for nausea, like ginger ale and ginger hard candy Make sure it says made with real ginger!  Sucking on sour candy like lemon heads is also good for nausea  If your prenatal vitamins make you nauseated, take them at night so you will sleep through the nausea  If you feel like you need medicine for the nausea & vomiting please let us know  If you are unable to keep any fluids or food down please let us know   Constipation  Drink plenty of fluid, preferably water, throughout the day  Eat foods high in fiber such as fruits, vegetables, and grains  Exercise, such as walking, is a good way to keep your bowels regular  Drink warm fluids, especially warm prune juice, or decaf coffee  Eat a 1/2 cup of real oatmeal (not instant), 1/2 cup applesauce, and 1/2-1 cup warm prune juice every day  If needed, you may take Colace (docusate sodium) stool softener once or twice a day to help keep the stool soft. If you are pregnant, wait until you are out of your first trimester (12-14 weeks of pregnancy)  If you still are having problems with constipation, you may take Miralax once daily as needed to help keep your bowels regular.  If you are pregnant, wait until you are out of your first trimester (12-14 weeks of pregnancy)    Pregnancy - First Trimester During sexual intercourse, millions of sperm go into the vagina. Only 1 sperm will penetrate and fertilize the female egg while it is in the Fallopian tube. One week later, the fertilized egg  implants into the wall of the uterus. An embryo begins to develop into a baby. At 6 to 8 weeks, the eyes and face are formed and the heartbeat can be seen on ultrasound. At the end of 12 weeks (first trimester), all the baby's organs are formed. Now that you are pregnant, you will want to do everything you can to have a healthy baby. Two of the most important things are to get good prenatal care and follow your caregiver's instructions. Prenatal care is all the medical care you receive before the baby's birth. It is given to prevent, find, and treat problems during the pregnancy and childbirth. PRENATAL EXAMS  During prenatal visits, your weight, blood pressure, and urine are checked. This is done to make sure you are healthy and progressing normally during the pregnancy.  A pregnant woman should gain 25 to 35 pounds during the pregnancy. However, if you are overweight or underweight, your caregiver will advise you regarding your weight.  Your caregiver will ask and answer questions for you.  Blood work, cervical cultures, other necessary tests, and a Pap test are done during your prenatal exams. These tests are done to check on your health and the probable health of your baby. Tests are strongly recommended and done for HIV with your permission. This is the virus that causes AIDS. These tests are done because medicines  can be given to help prevent your baby from being born with this infection should you have been infected without knowing it. Blood work is also used to find out your blood type, previous infections, and follow your blood levels (hemoglobin).  Low hemoglobin (anemia) is common during pregnancy. Iron and vitamins are given to help prevent this. Later in the pregnancy, blood tests for diabetes will be done along with any other tests if any problems develop.  You may need other tests to make sure you and the baby are doing well. CHANGES DURING THE FIRST TRIMESTER  Your body goes through  many changes during pregnancy. They vary from person to person. Talk to your caregiver about changes you notice and are concerned about. Changes can include:  Your menstrual period stops.  The egg and sperm carry the genes that determine what you look like. Genes from you and your partner are forming a baby. The female genes determine whether the baby is a boy or a girl.  Your body increases in girth and you may feel bloated.  Feeling sick to your stomach (nauseous) and throwing up (vomiting). If the vomiting is uncontrollable, call your caregiver.  Your breasts will begin to enlarge and become tender.  Your nipples may stick out more and become darker.  The need to urinate more. Painful urination may mean you have a bladder infection.  Tiring easily.  Loss of appetite.  Cravings for certain kinds of food.  At first, you may gain or lose a couple of pounds.  You may have changes in your emotions from day to day (excited to be pregnant or concerned something may go wrong with the pregnancy and baby).  You may have more vivid and strange dreams. HOME CARE INSTRUCTIONS   It is very important to avoid all smoking, alcohol and non-prescribed drugs during your pregnancy. These affect the formation and growth of the baby. Avoid chemicals while pregnant to ensure the delivery of a healthy infant.  Start your prenatal visits by the 12th week of pregnancy. They are usually scheduled monthly at first, then more often in the last 2 months before delivery. Keep your caregiver's appointments. Follow your caregiver's instructions regarding medicine use, blood and lab tests, exercise, and diet.  During pregnancy, you are providing food for you and your baby. Eat regular, well-balanced meals. Choose foods such as meat, fish, milk and other low fat dairy products, vegetables, fruits, and whole-grain breads and cereals. Your caregiver will tell you of the ideal weight gain.  You can help morning  sickness by keeping soda crackers at the bedside. Eat a couple before arising in the morning. You may want to use the crackers without salt on them.  Eating 4 to 5 small meals rather than 3 large meals a day also may help the nausea and vomiting.  Drinking liquids between meals instead of during meals also seems to help nausea and vomiting.  A physical sexual relationship may be continued throughout pregnancy if there are no other problems. Problems may be early (premature) leaking of amniotic fluid from the membranes, vaginal bleeding, or belly (abdominal) pain.  Exercise regularly if there are no restrictions. Check with your caregiver or physical therapist if you are unsure of the safety of some of your exercises. Greater weight gain will occur in the last 2 trimesters of pregnancy. Exercising will help:  Control your weight.  Keep you in shape.  Prepare you for labor and delivery.  Help you lose your pregnancy  weight after you deliver your baby.  Wear a good support or jogging bra for breast tenderness during pregnancy. This may help if worn during sleep too.  Ask when prenatal classes are available. Begin classes when they are offered.  Do not use hot tubs, steam rooms, or saunas.  Wear your seat belt when driving. This protects you and your baby if you are in an accident.  Avoid raw meat, uncooked cheese, cat litter boxes, and soil used by cats throughout the pregnancy. These carry germs that can cause birth defects in the baby.  The first trimester is a good time to visit your dentist for your dental health. Getting your teeth cleaned is okay. Use a softer toothbrush and brush gently during pregnancy.  Ask for help if you have financial, counseling, or nutritional needs during pregnancy. Your caregiver will be able to offer counseling for these needs as well as refer you for other special needs.  Do not take any medicines or herbs unless told by your caregiver.  Inform your  caregiver if there is any mental or physical domestic violence.  Make a list of emergency phone numbers of family, friends, hospital, and police and fire departments.  Write down your questions. Take them to your prenatal visit.  Do not douche.  Do not cross your legs.  If you have to stand for long periods of time, rotate you feet or take small steps in a circle.  You may have more vaginal secretions that may require a sanitary pad. Do not use tampons or scented sanitary pads. MEDICINES AND DRUG USE IN PREGNANCY  Take prenatal vitamins as directed. The vitamin should contain 1 milligram of folic acid. Keep all vitamins out of reach of children. Only a couple vitamins or tablets containing iron may be fatal to a baby or young child when ingested.  Avoid use of all medicines, including herbs, over-the-counter medicines, not prescribed or suggested by your caregiver. Only take over-the-counter or prescription medicines for pain, discomfort, or fever as directed by your caregiver. Do not use aspirin, ibuprofen, or naproxen unless directed by your caregiver.  Let your caregiver also know about herbs you may be using.  Alcohol is related to a number of birth defects. This includes fetal alcohol syndrome. All alcohol, in any form, should be avoided completely. Smoking will cause low birth rate and premature babies.  Street or illegal drugs are very harmful to the baby. They are absolutely forbidden. A baby born to an addicted mother will be addicted at birth. The baby will go through the same withdrawal an adult does.  Let your caregiver know about any medicines that you have to take and for what reason you take them. SEEK MEDICAL CARE IF:  You have any concerns or worries during your pregnancy. It is better to call with your questions if you feel they cannot wait, rather than worry about them. SEEK IMMEDIATE MEDICAL CARE IF:   An unexplained oral temperature above 102 F (38.9 C) develops,  or as your caregiver suggests.  You have leaking of fluid from the vagina (birth canal). If leaking membranes are suspected, take your temperature and inform your caregiver of this when you call.  There is vaginal spotting or bleeding. Notify your caregiver of the amount and how many pads are used.  You develop a bad smelling vaginal discharge with a change in the color.  You continue to feel sick to your stomach (nauseated) and have no relief from remedies suggested. You  vomit blood or coffee ground-like materials.  You lose more than 2 pounds of weight in 1 week.  You gain more than 2 pounds of weight in 1 week and you notice swelling of your face, hands, feet, or legs.  You gain 5 pounds or more in 1 week (even if you do not have swelling of your hands, face, legs, or feet).  You get exposed to [place] measles and have never had them.  You are exposed to fifth disease or chickenpox.  You develop belly (abdominal) pain. Round ligament discomfort is a common non-cancerous (benign) cause of abdominal pain in pregnancy. Your caregiver still must evaluate this.  You develop headache, fever, diarrhea, pain with urination, or shortness of breath.  You fall or are in a car accident or have any kind of trauma.  There is mental or physical violence in your home. Document Released: 11/03/2001 Document Revised: 08/03/2012 Document Reviewed: 05/07/2009 [facility] Patient Information 2014 [place].

## 2013-12-19 ENCOUNTER — Telehealth: Payer: Self-pay | Admitting: Women's Health

## 2013-12-19 ENCOUNTER — Encounter: Payer: Self-pay | Admitting: Women's Health

## 2013-12-19 LAB — RPR

## 2013-12-19 LAB — DRUG SCREEN, URINE, NO CONFIRMATION
Amphetamine Screen, Ur: NEGATIVE
BARBITURATE QUANT UR: NEGATIVE
Benzodiazepines.: NEGATIVE
COCAINE METABOLITES: NEGATIVE
CREATININE, U: 192.8 mg/dL
METHADONE: NEGATIVE
Marijuana Metabolite: NEGATIVE
Opiate Screen, Urine: NEGATIVE
PROPOXYPHENE: NEGATIVE
Phencyclidine (PCP): NEGATIVE

## 2013-12-19 LAB — GC/CHLAMYDIA PROBE AMP
CT Probe RNA: NEGATIVE
GC PROBE AMP APTIMA: NEGATIVE

## 2013-12-19 LAB — URINALYSIS
BILIRUBIN URINE: NEGATIVE
GLUCOSE, UA: NEGATIVE mg/dL
Hgb urine dipstick: NEGATIVE
Ketones, ur: NEGATIVE mg/dL
Leukocytes, UA: NEGATIVE
Nitrite: NEGATIVE
Protein, ur: NEGATIVE mg/dL
SPECIFIC GRAVITY, URINE: 1.024 (ref 1.005–1.030)
UROBILINOGEN UA: 0.2 mg/dL (ref 0.0–1.0)
pH: 6 (ref 5.0–8.0)

## 2013-12-19 LAB — URINE CULTURE
COLONY COUNT: NO GROWTH
ORGANISM ID, BACTERIA: NO GROWTH

## 2013-12-19 LAB — HEPATITIS B SURFACE ANTIGEN: Hepatitis B Surface Ag: NEGATIVE

## 2013-12-19 LAB — OXYCODONE SCREEN, UA, RFLX CONFIRM: OXYCODONE SCRN UR: NEGATIVE ng/mL

## 2013-12-19 LAB — RUBELLA SCREEN: Rubella: 1.28 Index — ABNORMAL HIGH (ref <0.90)

## 2013-12-19 LAB — VARICELLA ZOSTER ANTIBODY, IGG: Varicella IgG: 695.3 Index — ABNORMAL HIGH (ref <135.00)

## 2013-12-19 LAB — ABO AND RH: Rh Type: POSITIVE

## 2013-12-19 LAB — ANTIBODY SCREEN: Antibody Screen: NEGATIVE

## 2013-12-19 LAB — HIV ANTIBODY (ROUTINE TESTING W REFLEX): HIV: NONREACTIVE

## 2013-12-19 NOTE — Telephone Encounter (Signed)
Pt c/o runny nose, coughing, chest congestion, sore throat, no fever. Pt encouraged to push fluids, especially fluids with vit C, rest, hot teas, tylenol. Pt to call office back if no improvements. Pt saw Knute Neu, CNM yesterday and was told to use humidifier and nasal spray.

## 2014-01-12 ENCOUNTER — Ambulatory Visit (INDEPENDENT_AMBULATORY_CARE_PROVIDER_SITE_OTHER): Payer: Medicaid Other | Admitting: Obstetrics & Gynecology

## 2014-01-12 ENCOUNTER — Encounter: Payer: Self-pay | Admitting: Obstetrics & Gynecology

## 2014-01-12 VITALS — BP 100/60 | Wt 157.0 lb

## 2014-01-12 DIAGNOSIS — Z1389 Encounter for screening for other disorder: Secondary | ICD-10-CM

## 2014-01-12 DIAGNOSIS — O09299 Supervision of pregnancy with other poor reproductive or obstetric history, unspecified trimester: Secondary | ICD-10-CM

## 2014-01-12 DIAGNOSIS — Z331 Pregnant state, incidental: Secondary | ICD-10-CM

## 2014-01-12 DIAGNOSIS — N96 Recurrent pregnancy loss: Secondary | ICD-10-CM

## 2014-01-12 DIAGNOSIS — O2620 Pregnancy care for patient with recurrent pregnancy loss, unspecified trimester: Secondary | ICD-10-CM

## 2014-01-12 LAB — POCT URINALYSIS DIPSTICK
Blood, UA: NEGATIVE
GLUCOSE UA: NEGATIVE
KETONES UA: NEGATIVE
Leukocytes, UA: NEGATIVE
Nitrite, UA: NEGATIVE
Protein, UA: NEGATIVE

## 2014-01-12 NOTE — Progress Notes (Signed)
Declines IT, quad testing  No bleeding no problems, may stop prometrium in 2 1/2 weeks

## 2014-01-15 ENCOUNTER — Encounter: Payer: Medicaid Other | Admitting: Obstetrics & Gynecology

## 2014-02-07 ENCOUNTER — Telehealth: Payer: Self-pay | Admitting: Obstetrics & Gynecology

## 2014-02-07 NOTE — Telephone Encounter (Signed)
Pt c/o "mild cramping this past week", notice today "sharp vaginal pain", no vaginal bleeding. Pt informed to push fluids, rest as much as possible, can take tylenol if no improvement pt to call our office back. Pt encouraged to go to [facility] for vaginal bleeding or severe cramping. Pt verbalized understanding.

## 2014-02-09 ENCOUNTER — Encounter: Payer: Self-pay | Admitting: Obstetrics and Gynecology

## 2014-02-09 ENCOUNTER — Ambulatory Visit (INDEPENDENT_AMBULATORY_CARE_PROVIDER_SITE_OTHER): Payer: 59 | Admitting: Obstetrics and Gynecology

## 2014-02-09 VITALS — BP 110/76 | Wt 158.0 lb

## 2014-02-09 DIAGNOSIS — Z331 Pregnant state, incidental: Secondary | ICD-10-CM

## 2014-02-09 DIAGNOSIS — O09299 Supervision of pregnancy with other poor reproductive or obstetric history, unspecified trimester: Secondary | ICD-10-CM

## 2014-02-09 DIAGNOSIS — O2620 Pregnancy care for patient with recurrent pregnancy loss, unspecified trimester: Secondary | ICD-10-CM

## 2014-02-09 DIAGNOSIS — Z1389 Encounter for screening for other disorder: Secondary | ICD-10-CM

## 2014-02-09 DIAGNOSIS — N949 Unspecified condition associated with female genital organs and menstrual cycle: Secondary | ICD-10-CM | POA: Insufficient documentation

## 2014-02-09 DIAGNOSIS — Z3490 Encounter for supervision of normal pregnancy, unspecified, unspecified trimester: Secondary | ICD-10-CM

## 2014-02-09 LAB — POCT URINALYSIS DIPSTICK
Blood, UA: NEGATIVE
Glucose, UA: NEGATIVE
Ketones, UA: NEGATIVE
LEUKOCYTES UA: NEGATIVE
Nitrite, UA: NEGATIVE
Protein, UA: NEGATIVE

## 2014-02-09 NOTE — Patient Instructions (Addendum)
  Please check out [email or web address] and then choose pregnancy and childbirth for more information on childbirth classes   Abdominal Pain During Pregnancy Abdominal pain is common in pregnancy. Most of the time, it does not cause harm. There are many causes of abdominal pain. Some causes are more serious than others. Some of the causes of abdominal pain in pregnancy are easily diagnosed. Occasionally, the diagnosis takes time to understand. Other times, the cause is not determined. Abdominal pain can be a sign that something is very wrong with the pregnancy, or the pain may have nothing to do with the pregnancy at all. For this reason, always tell your health care provider if you have any abdominal discomfort. HOME CARE INSTRUCTIONS  Monitor your abdominal pain for any changes. The following actions may help to alleviate any discomfort you are experiencing:  Do not have sexual intercourse or put anything in your vagina until your symptoms go away completely.  Get plenty of rest until your pain improves.  Drink clear fluids if you feel nauseous. Avoid solid food as long as you are uncomfortable or nauseous.  Only take over-the-counter or prescription medicine as directed by your health care provider.  Keep all follow-up appointments with your health care provider. SEEK IMMEDIATE MEDICAL CARE IF:  You are bleeding, leaking fluid, or passing tissue from the vagina.  You have increasing pain or cramping.  You have persistent vomiting.  You have painful or bloody urination.  You have a fever.  You notice a decrease in your baby's movements.  You have extreme weakness or feel faint.  You have shortness of breath, with or without abdominal pain.  You develop a severe headache with abdominal pain.  You have abnormal vaginal discharge with abdominal pain.  You have persistent diarrhea.  You have abdominal pain that continues even after rest, or gets  worse. MAKE SURE YOU:   Understand these instructions.  Will watch your condition.  Will get help right away if you are not doing well or get worse. Document Released: 11/09/2005 Document Revised: 08/30/2013 Document Reviewed: 06/08/2013 [facility] Patient Information 2014 [place], [place].

## 2014-02-09 NOTE — Progress Notes (Signed)
Pt states that she has been having cramping/shocking pains for the past week.

## 2014-02-09 NOTE — Progress Notes (Signed)
[redacted]w[redacted]d. G5P0A0. +vaginal pain described as cramping that radiates to mid abdomen x 1 week. Denies vaginal bleeding or spotting, change in vaginal discharge. Genetic testing discussed. Pt declined extensive testing. Childbirth classes encouraged. Pt's questions answered to apparent satisfaction.    This chart was scribed by Jenne Campus, Medical Scribe, for Dr. Mallory Shirk on 02/09/14 at 9:31 AM. This chart was reviewed by Dr. Mallory Shirk and is accurate.

## 2014-02-12 ENCOUNTER — Ambulatory Visit (INDEPENDENT_AMBULATORY_CARE_PROVIDER_SITE_OTHER): Payer: 59 | Admitting: Family Medicine

## 2014-02-12 DIAGNOSIS — H01139 Eczematous dermatitis of unspecified eye, unspecified eyelid: Secondary | ICD-10-CM

## 2014-02-12 MED ORDER — SILVER SULFADIAZINE 1 % EX CREA: 1.0000 "application " | TOPICAL_CREAM | Freq: Two times a day (BID) | CUTANEOUS | Status: DC

## 2014-02-12 NOTE — Progress Notes (Signed)
   Subjective:    Patient ID: Cynthia Hood, female    DOB: May 26, 1986, 28 y.o.   MRN: [id number]  HPI  This 29 y.o. female presents for evaluation of left eye redness and irritation.  Review of Systems    No chest pain, SOB, HA, dizziness, vision change, N/V, diarrhea, constipation, dysuria, urinary urgency or frequency, myalgias, arthralgias or rash.  Objective:   Physical Exam  Vital signs noted  Well developed well nourished female.  HEENT - Head atraumatic Normocephalic                Eyes - PERRLA, Conjuctiva - clear Sclera- Clear EOMI                           Left eyelid erythematous and swollen.  No stye.                Ears - EAC's Wnl TM's Wnl Gross Hearing WNL                Nose - Nares patent                 Throat - oropharanx wnl Respiratory - Lungs CTA bilateral Cardiac - RRR S1 and S2 without murmur GI - Abdomen soft Nontender and bowel sounds active x 4       Assessment & Plan:  Eczematous dermatitis of eyelid - Plan: silver sulfADIAZINE (SILVADENE) 1 % cream Take tylenol and motrin otc as directed and follow up prn.  Lysbeth Penner FNP

## 2014-03-06 ENCOUNTER — Other Ambulatory Visit: Payer: Self-pay | Admitting: Obstetrics and Gynecology

## 2014-03-06 DIAGNOSIS — O2620 Pregnancy care for patient with recurrent pregnancy loss, unspecified trimester: Secondary | ICD-10-CM

## 2014-03-09 ENCOUNTER — Ambulatory Visit (INDEPENDENT_AMBULATORY_CARE_PROVIDER_SITE_OTHER): Payer: 59 | Admitting: Obstetrics & Gynecology

## 2014-03-09 ENCOUNTER — Ambulatory Visit (INDEPENDENT_AMBULATORY_CARE_PROVIDER_SITE_OTHER): Payer: 59

## 2014-03-09 ENCOUNTER — Encounter: Payer: Self-pay | Admitting: Obstetrics & Gynecology

## 2014-03-09 VITALS — BP 110/62 | Wt 159.0 lb

## 2014-03-09 DIAGNOSIS — O2620 Pregnancy care for patient with recurrent pregnancy loss, unspecified trimester: Secondary | ICD-10-CM

## 2014-03-09 DIAGNOSIS — Q270 Congenital absence and hypoplasia of umbilical artery: Secondary | ICD-10-CM | POA: Insufficient documentation

## 2014-03-09 DIAGNOSIS — O09299 Supervision of pregnancy with other poor reproductive or obstetric history, unspecified trimester: Secondary | ICD-10-CM

## 2014-03-09 DIAGNOSIS — Z331 Pregnant state, incidental: Secondary | ICD-10-CM

## 2014-03-09 DIAGNOSIS — Z1389 Encounter for screening for other disorder: Secondary | ICD-10-CM

## 2014-03-09 LAB — POCT URINALYSIS DIPSTICK
Blood, UA: NEGATIVE
GLUCOSE UA: NEGATIVE
Ketones, UA: NEGATIVE
LEUKOCYTES UA: NEGATIVE
NITRITE UA: NEGATIVE
Protein, UA: NEGATIVE

## 2014-03-09 NOTE — Assessment & Plan Note (Signed)
Monthly growth scans beginning at 28 weeks then fetal testing if abnormal growth

## 2014-03-09 NOTE — Progress Notes (Signed)
Sonogram is reviewed and report is done. 2 vessel vcord: monthly growth scans(first at 28 weeks) and testing if indicated by growth issues  BP weight and urine results all reviewed and noted. Patient reports good fetal movement, denies any bleeding and no rupture of membranes symptoms or regular contractions. Patient is without complaints. All questions were answered.

## 2014-03-09 NOTE — Progress Notes (Signed)
U/S(19+2wks)-single active fetus, meas c/w dates, fluid wnl, anterior Gr 0 placenta, cx appears closed (3.4cm), bilateral adnexa wnl, FHR-132 bpm, female fetus, 2VC noted (Lt absent) no other abnl noted

## 2014-03-26 ENCOUNTER — Encounter: Payer: Self-pay | Admitting: Obstetrics and Gynecology

## 2014-03-26 ENCOUNTER — Ambulatory Visit (INDEPENDENT_AMBULATORY_CARE_PROVIDER_SITE_OTHER): Payer: 59 | Admitting: Obstetrics and Gynecology

## 2014-03-26 ENCOUNTER — Telehealth: Payer: Self-pay | Admitting: Obstetrics and Gynecology

## 2014-03-26 VITALS — BP 136/56 | Wt 156.4 lb

## 2014-03-26 DIAGNOSIS — O2620 Pregnancy care for patient with recurrent pregnancy loss, unspecified trimester: Secondary | ICD-10-CM

## 2014-03-26 DIAGNOSIS — Z1389 Encounter for screening for other disorder: Secondary | ICD-10-CM

## 2014-03-26 DIAGNOSIS — O09299 Supervision of pregnancy with other poor reproductive or obstetric history, unspecified trimester: Secondary | ICD-10-CM

## 2014-03-26 DIAGNOSIS — Z331 Pregnant state, incidental: Secondary | ICD-10-CM

## 2014-03-26 LAB — POCT URINALYSIS DIPSTICK
Glucose, UA: NEGATIVE
Ketones, UA: NEGATIVE
Leukocytes, UA: NEGATIVE
NITRITE UA: NEGATIVE
Protein, UA: NEGATIVE
RBC UA: NEGATIVE

## 2014-03-26 MED ORDER — GABAPENTIN 300 MG PO CAPS: 300.0000 mg | ORAL_CAPSULE | Freq: Three times a day (TID) | ORAL | Status: DC

## 2014-03-26 MED ORDER — CYCLOBENZAPRINE HCL 5 MG PO TABS: 5.0000 mg | ORAL_TABLET | Freq: Three times a day (TID) | ORAL | Status: DC | PRN

## 2014-03-26 NOTE — Progress Notes (Signed)
[redacted]w[redacted]d G21P0040 female presents for constant, unchanged lower neck and upper back pain with associated radiation and spasm to the left upper extremity. The pain is worse with laying down. She has taken 2 tylenol and heating pad with minimal relief.  O: PEx: tenderness in paraspinous muscles, infraspinatus, with visible muscle twitching into triceps.(1/second) A. Muscle fasciculation. Muscle spasm P:  1. Flexeril5mg   2. Neurontin300 tid 3. Tylenol as needed Out of work x 1wk ([employer])

## 2014-03-26 NOTE — Patient Instructions (Signed)
Begin taking the neurontin 3x daily x 1wk add flexeril 5 mg tid if needed for spasm and pain If unimproved in 3 days, notify office, we can consider physical therapy referral.

## 2014-03-26 NOTE — Telephone Encounter (Signed)
Pt states woke up with pain in neck radiating down shoulder, muscle spasms in arm. "barely able to hold head up" Tylenol not helping. Call transferred to front staff for an appt to be made for today.

## 2014-03-30 ENCOUNTER — Telehealth: Payer: Self-pay | Admitting: Obstetrics and Gynecology

## 2014-03-30 DIAGNOSIS — Q270 Congenital absence and hypoplasia of umbilical artery: Secondary | ICD-10-CM

## 2014-03-30 DIAGNOSIS — Z3480 Encounter for supervision of other normal pregnancy, unspecified trimester: Secondary | ICD-10-CM

## 2014-03-30 NOTE — Telephone Encounter (Signed)
Pt states that she has been to the chiropractor x2 and the chiropractor suggested that she be out for another week. I spoke with Dr. Glo Herring and he advised that the chiropractor would have to write that note to be out of work for another week.Pt aware of this .

## 2014-04-06 ENCOUNTER — Ambulatory Visit (INDEPENDENT_AMBULATORY_CARE_PROVIDER_SITE_OTHER): Payer: 59 | Admitting: Obstetrics & Gynecology

## 2014-04-06 VITALS — BP 128/58 | Wt 161.5 lb

## 2014-04-06 DIAGNOSIS — Z331 Pregnant state, incidental: Secondary | ICD-10-CM

## 2014-04-06 DIAGNOSIS — M546 Pain in thoracic spine: Secondary | ICD-10-CM

## 2014-04-06 DIAGNOSIS — M542 Cervicalgia: Secondary | ICD-10-CM

## 2014-04-06 DIAGNOSIS — Z1389 Encounter for screening for other disorder: Secondary | ICD-10-CM

## 2014-04-06 LAB — POCT URINALYSIS DIPSTICK
Blood, UA: NEGATIVE
Glucose, UA: NEGATIVE
Ketones, UA: NEGATIVE
Nitrite, UA: NEGATIVE
PROTEIN UA: NEGATIVE

## 2014-04-06 MED ORDER — METAXALONE 800 MG PO TABS: 800.0000 mg | ORAL_TABLET | Freq: Three times a day (TID) | ORAL | Status: DC

## 2014-04-06 NOTE — Progress Notes (Signed)
Pt with left arm, neck and thoracic/scapular pain, injected with 0.5% marcaine, 10 cc total , 5 neck left, 5 rhomboids left Discussed home therapy  Rx skelaxin TID  Ice/biofeeze  BP weight and urine results all reviewed and noted. Patient reports good fetal movement, denies any bleeding and no rupture of membranes symptoms or regular contractions. Patient is without complaints. All questions were answered.  see me 1 week to reassess her neck and back

## 2014-04-13 ENCOUNTER — Telehealth: Payer: Self-pay | Admitting: *Deleted

## 2014-04-13 ENCOUNTER — Ambulatory Visit: Payer: 59 | Admitting: Obstetrics & Gynecology

## 2014-04-13 NOTE — Telephone Encounter (Signed)
Spoke to pt. Pt missed appt today. Needs to reschedule today's appt and schedule sugar test and [place]. Call transferred to front desk for appt. [place]

## 2014-05-07 ENCOUNTER — Other Ambulatory Visit: Payer: Self-pay | Admitting: Obstetrics & Gynecology

## 2014-05-11 ENCOUNTER — Encounter: Payer: 59 | Admitting: Obstetrics and Gynecology

## 2014-05-11 ENCOUNTER — Other Ambulatory Visit: Payer: 59

## 2014-05-11 ENCOUNTER — Ambulatory Visit (INDEPENDENT_AMBULATORY_CARE_PROVIDER_SITE_OTHER): Payer: 59

## 2014-05-11 DIAGNOSIS — Z3483 Encounter for supervision of other normal pregnancy, third trimester: Secondary | ICD-10-CM

## 2014-05-11 DIAGNOSIS — IMO0001 Reserved for inherently not codable concepts without codable children: Secondary | ICD-10-CM

## 2014-05-11 LAB — CBC
HCT: 32.8 % — ABNORMAL LOW (ref 36.0–46.0)
HEMOGLOBIN: 11.5 g/dL — AB (ref 12.0–15.0)
MCH: 30 pg (ref 26.0–34.0)
MCHC: 35.1 g/dL (ref 30.0–36.0)
MCV: 85.6 fL (ref 78.0–100.0)
PLATELETS: 184 10*3/uL (ref 150–400)
RBC: 3.83 MIL/uL — ABNORMAL LOW (ref 3.87–5.11)
RDW: 13.4 % (ref 11.5–15.5)
WBC: 15.5 10*3/uL — ABNORMAL HIGH (ref 4.0–10.5)

## 2014-05-11 NOTE — Progress Notes (Signed)
U/S(28+2wks)-active fetus, meas c/w dates, appropiate growth EFw 2 lb 9 oz (48th%tile), fluid wnl, anterior Gr 1 placenta, cx appears closed (3.2cm), bilateral adnexa appear wnl, 2VC noted, FHR- 143 bpm

## 2014-05-12 LAB — GLUCOSE TOLERANCE, 2 HOURS W/ 1HR
Glucose, 1 hour: 132 mg/dL (ref 70–170)
Glucose, 2 hour: 106 mg/dL (ref 70–139)
Glucose, Fasting: 66 mg/dL — ABNORMAL LOW (ref 70–99)

## 2014-05-12 LAB — RPR

## 2014-05-12 LAB — ANTIBODY SCREEN: ANTIBODY SCREEN: NEGATIVE

## 2014-05-12 LAB — HIV ANTIBODY (ROUTINE TESTING W REFLEX): HIV: NONREACTIVE

## 2014-05-14 ENCOUNTER — Encounter: Payer: Self-pay | Admitting: Obstetrics & Gynecology

## 2014-05-14 ENCOUNTER — Encounter: Payer: Self-pay | Admitting: Adult Health

## 2014-05-14 ENCOUNTER — Ambulatory Visit (INDEPENDENT_AMBULATORY_CARE_PROVIDER_SITE_OTHER): Payer: 59 | Admitting: Obstetrics & Gynecology

## 2014-05-14 VITALS — BP 120/80 | Wt 166.3 lb

## 2014-05-14 DIAGNOSIS — O30009 Twin pregnancy, unspecified number of placenta and unspecified number of amniotic sacs, unspecified trimester: Secondary | ICD-10-CM

## 2014-05-14 DIAGNOSIS — IMO0001 Reserved for inherently not codable concepts without codable children: Secondary | ICD-10-CM

## 2014-05-14 DIAGNOSIS — Z3483 Encounter for supervision of other normal pregnancy, third trimester: Secondary | ICD-10-CM

## 2014-05-14 DIAGNOSIS — Z1389 Encounter for screening for other disorder: Secondary | ICD-10-CM

## 2014-05-14 DIAGNOSIS — Z3480 Encounter for supervision of other normal pregnancy, unspecified trimester: Secondary | ICD-10-CM

## 2014-05-14 DIAGNOSIS — Z331 Pregnant state, incidental: Secondary | ICD-10-CM

## 2014-05-14 LAB — POCT URINALYSIS DIPSTICK
Blood, UA: NEGATIVE
GLUCOSE UA: NEGATIVE
KETONES UA: NEGATIVE
Nitrite, UA: NEGATIVE
Protein, UA: NEGATIVE

## 2014-05-14 LAB — HSV 2 ANTIBODY, IGG: HSV 2 Glycoprotein G Ab, IgG: 5.83 IV — ABNORMAL HIGH

## 2014-05-14 NOTE — Progress Notes (Signed)
Neck and arm has been feeling fine, no problems  Sonogram reviewed and good growth, normal  counselled regarding + HSV2  G5P0040 [redacted]w[redacted]d Estimated Date of Delivery: 08/01/14  Blood pressure 120/80, weight 166 lb 4.8 oz (75.433 kg), last menstrual period 10/25/2013.   BP weight and urine results all reviewed and noted.  Please refer to the obstetrical flow sheet for the fundal height and fetal heart rate documentation:  Patient reports good fetal movement, denies any bleeding and no rupture of membranes symptoms or regular contractions. Patient is without complaints. All questions were answered.  Plan:  Continued routine obstetrical care,   Follow up in 3 weeks for OB appointment, sonogram

## 2014-05-29 IMAGING — US US OB COMP LESS 14 WK
1 series · 14 of 28 positions shown · non-contrast
Comparison: None.

CLINICAL DATA: Vaginal bleeding. Pelvic pain and cramping.
Gestational age by LMP of six weeks 3 days.

EXAM:
OBSTETRIC <14 WK US AND TRANSVAGINAL OB US
TECHNIQUE: Both transabdominal and transvaginal ultrasound examinations were
performed for complete evaluation of the gestation as well as the
maternal uterus, adnexal regions, and pelvic cul-de-sac.
Transvaginal technique was performed to assess early pregnancy.

[Series 1: us ob comp less 14 wk · 0.17mm/px · 14 of 105 slices shown]
[im 4/105]
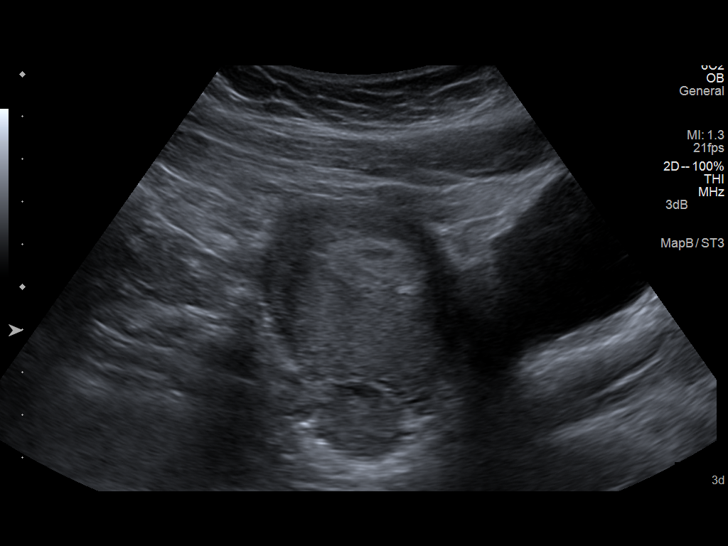
[im 12/105]
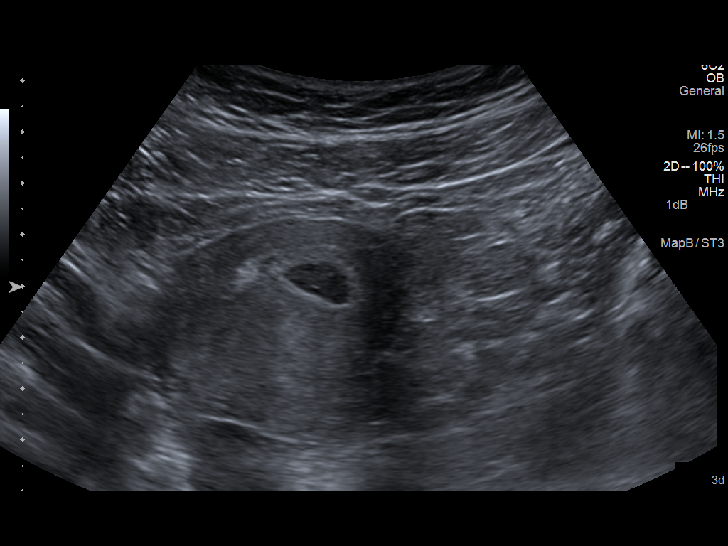
[im 20/105]
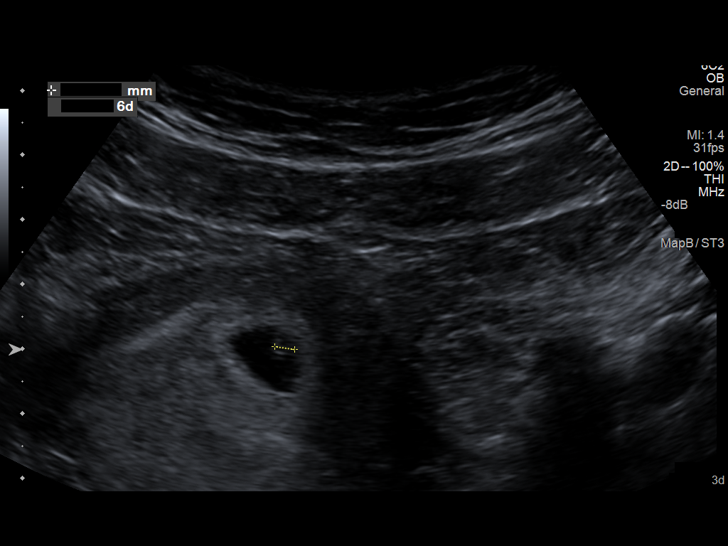
[im 27/105]
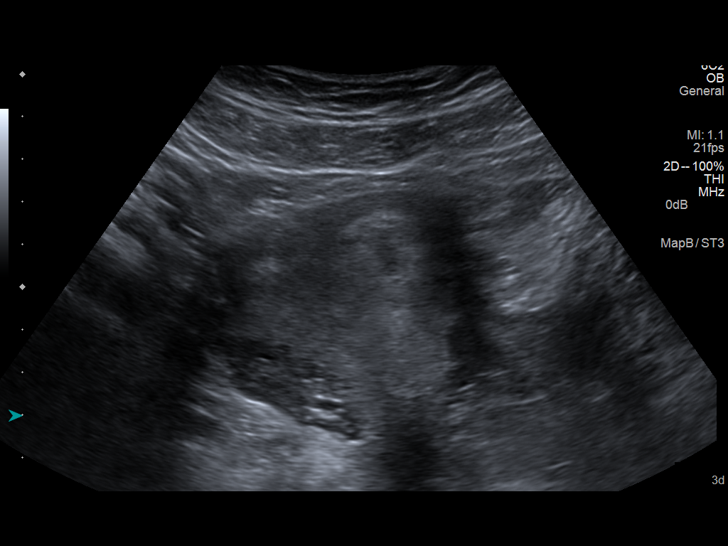
[im 35/105]
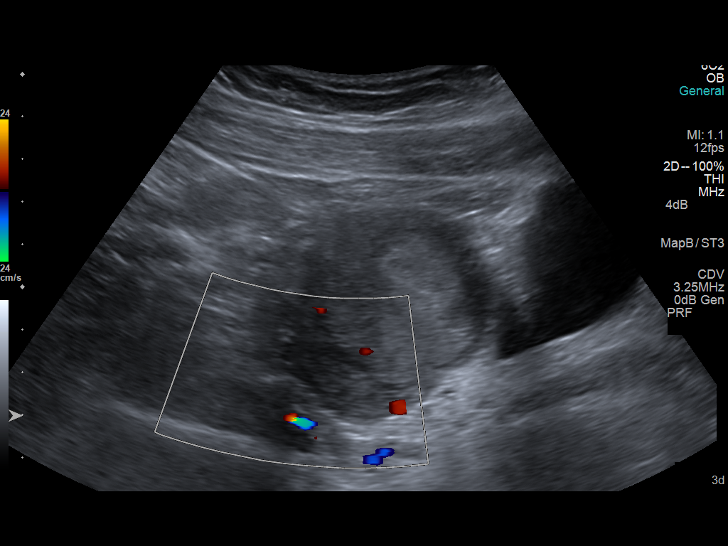
[im 43/105]
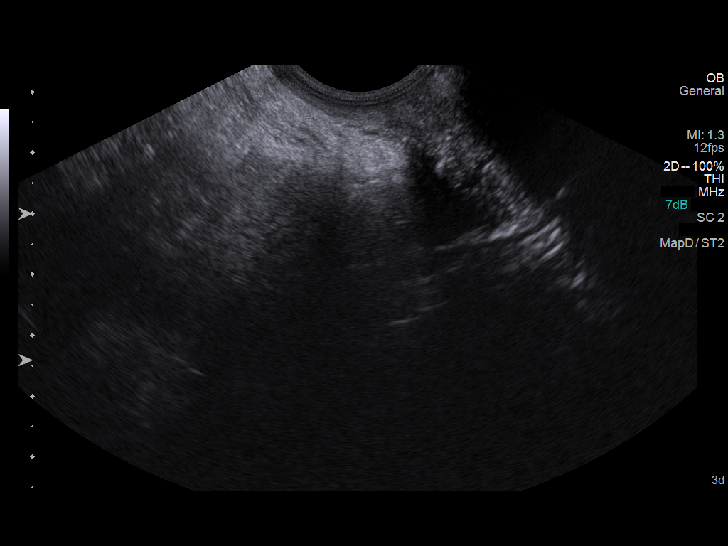
[im 51/105]
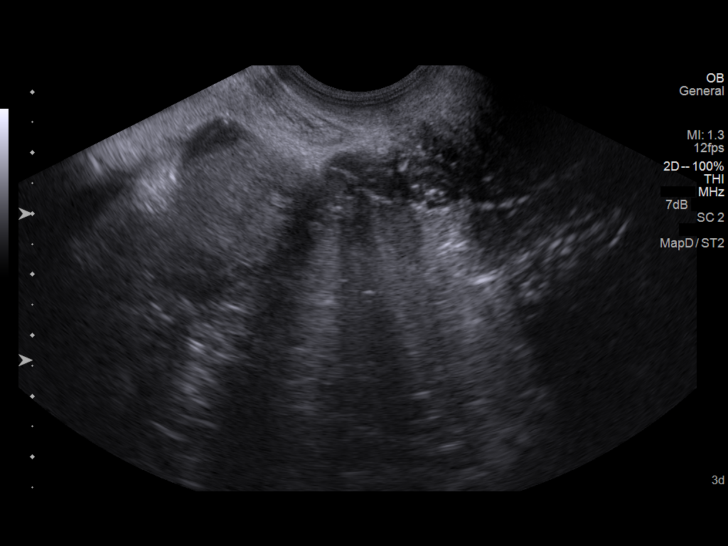
[im 58/105]
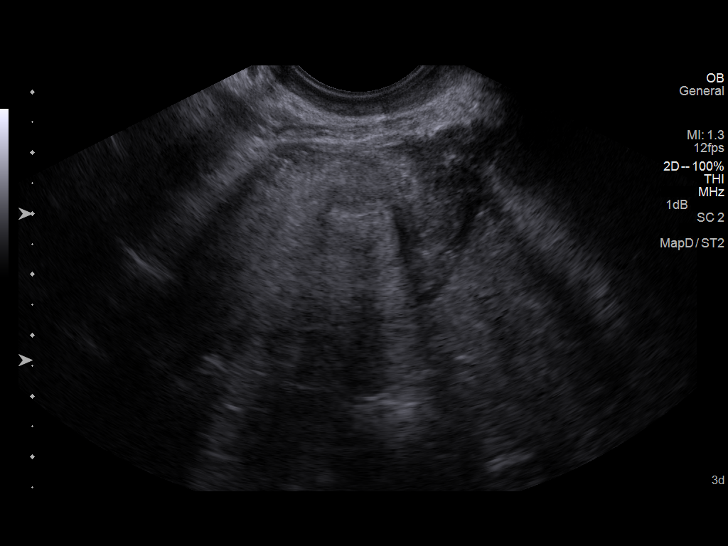
[im 66/105]
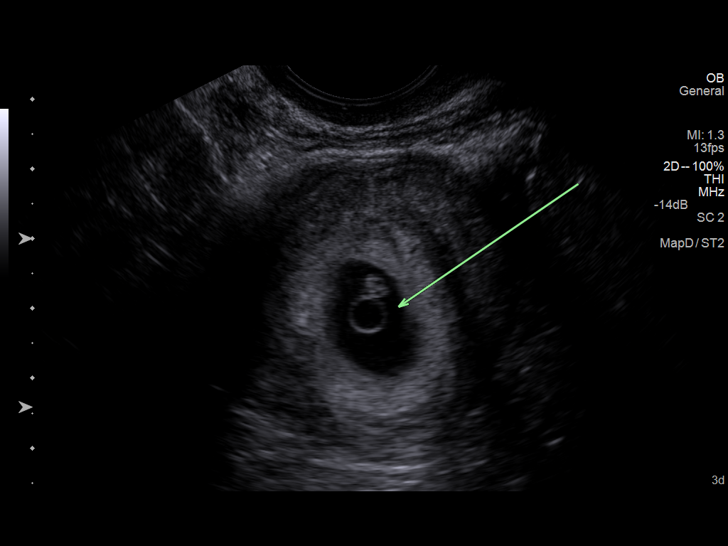
[im 74/105]
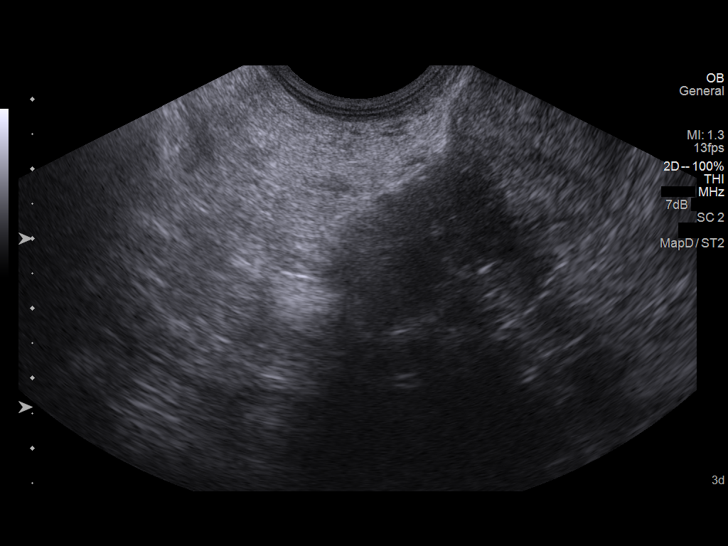
[im 81/105]
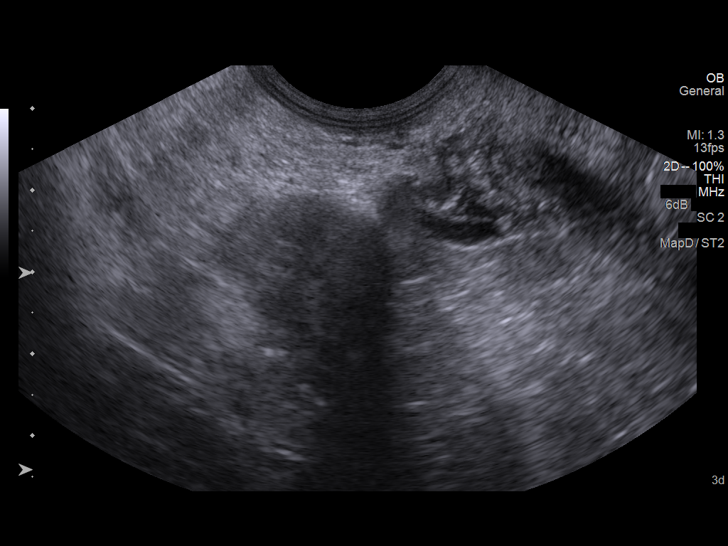
[im 89/105]
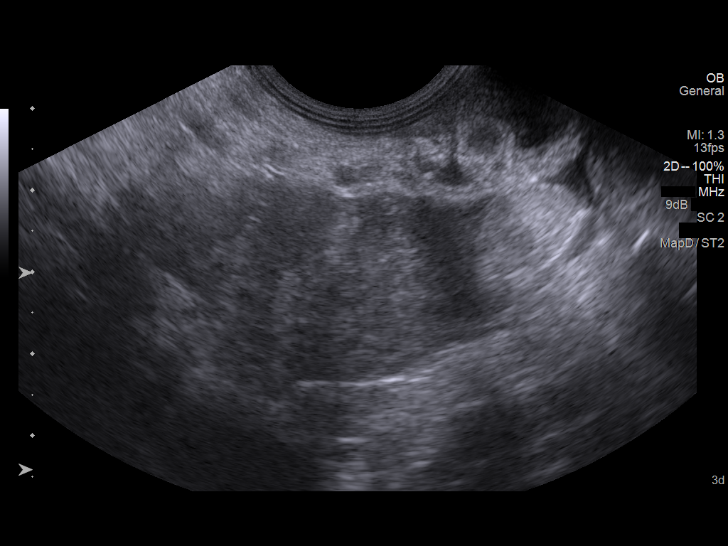
[im 97/105]
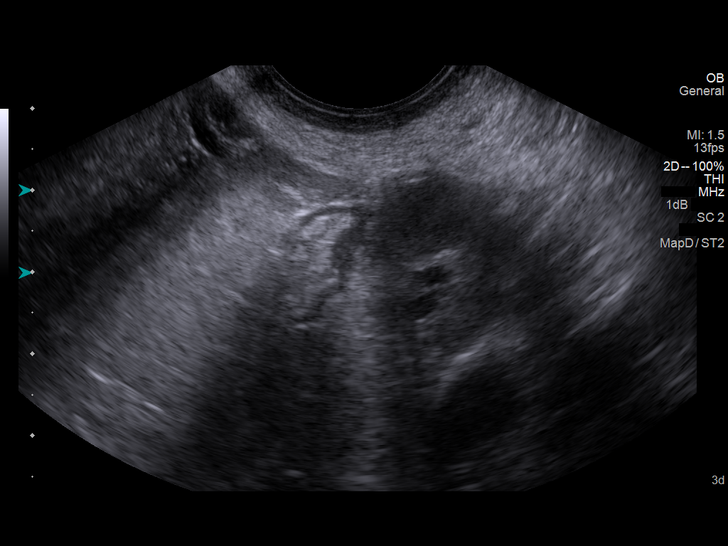
[im 105/105]
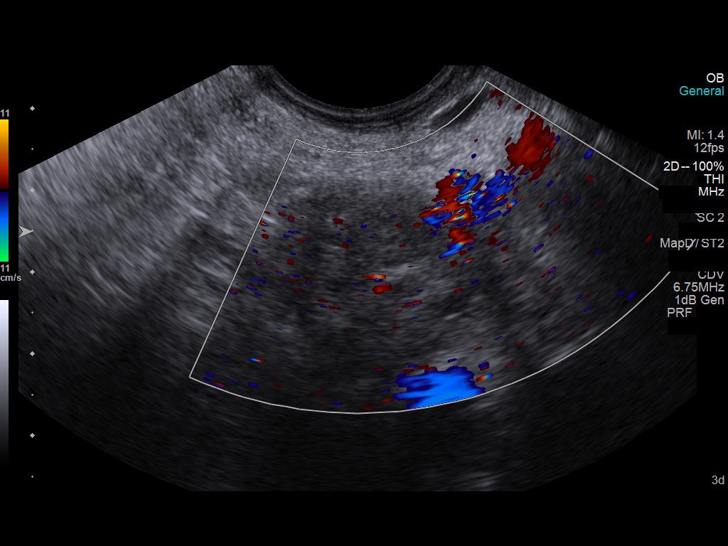

[14 of 28 positions shown; findings below may reference images not displayed]

FINDINGS: Intrauterine gestational sac: Visualized/normal in shape.

Yolk sac:  Visualized

Embryo:  Visualized

Cardiac Activity: Visualized

Heart Rate:  121 bpm

CRL:   4  mm   6 w 1 d                  US EDC: 08/03/2014

Maternal uterus/adnexae: No subchorionic hemorrhage visualized. Both
ovaries are normal in appearance. No mass or free fluid identified.
IMPRESSION: Single living IUP measuring 6 weeks 1 day, which is concordant with
given LMP.

No significant maternal uterine or adnexal abnormality identified.

## 2014-06-04 ENCOUNTER — Encounter: Payer: Self-pay | Admitting: Obstetrics & Gynecology

## 2014-06-04 ENCOUNTER — Ambulatory Visit (INDEPENDENT_AMBULATORY_CARE_PROVIDER_SITE_OTHER): Payer: 59

## 2014-06-04 ENCOUNTER — Other Ambulatory Visit: Payer: Self-pay | Admitting: Obstetrics & Gynecology

## 2014-06-04 ENCOUNTER — Ambulatory Visit (INDEPENDENT_AMBULATORY_CARE_PROVIDER_SITE_OTHER): Payer: 59 | Admitting: Obstetrics & Gynecology

## 2014-06-04 VITALS — BP 100/60 | Wt 169.0 lb

## 2014-06-04 DIAGNOSIS — Z1389 Encounter for screening for other disorder: Secondary | ICD-10-CM

## 2014-06-04 DIAGNOSIS — Z3480 Encounter for supervision of other normal pregnancy, unspecified trimester: Secondary | ICD-10-CM

## 2014-06-04 DIAGNOSIS — IMO0001 Reserved for inherently not codable concepts without codable children: Secondary | ICD-10-CM

## 2014-06-04 DIAGNOSIS — O3090 Multiple gestation, unspecified, unspecified trimester: Secondary | ICD-10-CM

## 2014-06-04 DIAGNOSIS — Z331 Pregnant state, incidental: Secondary | ICD-10-CM

## 2014-06-04 LAB — POCT URINALYSIS DIPSTICK
Blood, UA: NEGATIVE
Glucose, UA: NEGATIVE
LEUKOCYTES UA: NEGATIVE
Nitrite, UA: NEGATIVE
PROTEIN UA: NEGATIVE

## 2014-06-04 NOTE — Progress Notes (Signed)
Sonogram is reviewed read and report done: normal growth  G5P0040 [redacted]w[redacted]d Estimated Date of Delivery: 08/01/14  Blood pressure 100/60, weight 169 lb (76.658 kg), last menstrual period 10/25/2013.   BP weight and urine results all reviewed and noted.  Please refer to the obstetrical flow sheet for the fundal height and fetal heart rate documentation:  Patient reports good fetal movement, denies any bleeding and no rupture of membranes symptoms or regular contractions. Patient is without complaints. All questions were answered.  Plan:  Continued routine obstetrical care,   Follow up in 2 weeks for OB appointment, routine

## 2014-06-04 NOTE — Progress Notes (Signed)
U/S(31+5wks)-vtx active fetus, approp growth EFW 4 lb 1 oz (49th%tile), fluid WNL AFI-17.6cm SDP-6.2cm, FHR-139 bpm, female fetus, 2VC noted,  Anterior Gr 1 placenta

## 2014-06-19 ENCOUNTER — Ambulatory Visit (INDEPENDENT_AMBULATORY_CARE_PROVIDER_SITE_OTHER): Payer: 59 | Admitting: Obstetrics & Gynecology

## 2014-06-19 ENCOUNTER — Encounter: Payer: Self-pay | Admitting: Obstetrics & Gynecology

## 2014-06-19 VITALS — BP 120/70 | Wt 171.0 lb

## 2014-06-19 DIAGNOSIS — Z1389 Encounter for screening for other disorder: Secondary | ICD-10-CM

## 2014-06-19 DIAGNOSIS — Z331 Pregnant state, incidental: Secondary | ICD-10-CM

## 2014-06-19 DIAGNOSIS — Z029 Encounter for administrative examinations, unspecified: Secondary | ICD-10-CM

## 2014-06-19 DIAGNOSIS — Z3480 Encounter for supervision of other normal pregnancy, unspecified trimester: Secondary | ICD-10-CM

## 2014-06-19 DIAGNOSIS — Q270 Congenital absence and hypoplasia of umbilical artery: Secondary | ICD-10-CM

## 2014-06-19 LAB — POCT URINALYSIS DIPSTICK
Blood, UA: NEGATIVE
Glucose, UA: NEGATIVE
Ketones, UA: NEGATIVE
Leukocytes, UA: NEGATIVE
Nitrite, UA: NEGATIVE
Protein, UA: NEGATIVE

## 2014-06-19 MED ORDER — ACYCLOVIR 400 MG PO TABS: 400.0000 mg | ORAL_TABLET | Freq: Three times a day (TID) | ORAL | Status: DC

## 2014-06-25 ENCOUNTER — Encounter (HOSPITAL_COMMUNITY): Payer: Self-pay | Admitting: *Deleted

## 2014-06-25 ENCOUNTER — Inpatient Hospital Stay (HOSPITAL_COMMUNITY)
Admission: AD | Admit: 2014-06-25 | Discharge: 2014-06-25 | Disposition: A | Discharge status: Home / Self Care | Payer: Medicaid Other | Source: Ambulatory Visit | Attending: Obstetrics & Gynecology | Admitting: Obstetrics & Gynecology

## 2014-06-25 DIAGNOSIS — M549 Dorsalgia, unspecified: Secondary | ICD-10-CM | POA: Diagnosis not present

## 2014-06-25 DIAGNOSIS — O4700 False labor before 37 completed weeks of gestation, unspecified trimester: Secondary | ICD-10-CM | POA: Diagnosis not present

## 2014-06-25 DIAGNOSIS — O479 False labor, unspecified: Secondary | ICD-10-CM

## 2014-06-25 DIAGNOSIS — R109 Unspecified abdominal pain: Secondary | ICD-10-CM | POA: Diagnosis not present

## 2014-06-25 DIAGNOSIS — N898 Other specified noninflammatory disorders of vagina: Secondary | ICD-10-CM

## 2014-06-25 NOTE — MAU Note (Signed)
Patient presents to MAU with c/o contractions; reports intermittent cramping in lower abdomen and back. Reports a clear trickle of fluid since around 4 pm today; has happened off and on since then; wearing a panty liner. Last intercourse this am. Denies VB at this time. +FM.

## 2014-06-25 NOTE — MAU Note (Signed)
Not in lobby

## 2014-06-26 NOTE — OB Triage Provider Note (Signed)
Chief Complaint:  R/o ROM  First seen by MAU provider on 8/3 @ 10:45 pm    HPI: Cynthia Hood is a 28 y.o. G5P0040 at [redacted]w[redacted]d who presents to maternity admissions reporting possible ROM.  Per patient, this AM she woke up and noted a small gush of fluid. She continued to have a small trickle throughout the day. Did not need to change underwear. Did put a small amount of tissue in her underwear that did get wet and she needed to throw out.   In addition, she notes intermittent episodes of abdominal cramping. States these episodes happen about 2-3x and hour. Not intense, 3/10 pain that only last for several seconds.   She does report vaginal intercourse this AM.  Denies vaginal bleeding. Good fetal movement.   Pregnancy Course:  2 v cord HSV 2+  Past Medical History: Past Medical History  Diagnosis Date  . Ectopic pregnancy   . Colitis, ulcerative     Past obstetric history: OB History  Gravida Para Term Preterm AB SAB TAB Ectopic Multiple Living  5    4 4         # Outcome Date GA Lbr Len/2nd Weight Sex Delivery Anes PTL Lv  5 CUR           4 SAB 2014          3 SAB 2014          2 SAB 2011          1 SAB 2007              Past Surgical History: Past Surgical History  Procedure Laterality Date  . No past surgeries       Family History: Family History  Problem Relation Age of Onset  . Cancer Other   . Diabetes Father   . Hypertension Father   . Heart disease Father     Social History: History  Substance Use Topics  . Smoking status: Light Tobacco Smoker -- 0.50 packs/day for 12 years    Types: Cigarettes  . Smokeless tobacco: Never Used  . Alcohol Use: No     Comment: occ    Allergies: No Known Allergies  Meds:  No prescriptions prior to admission    ROS: Pertinent findings in history of present illness.  Physical Exam  Last menstrual period 10/25/2013. GENERAL: Well-developed, well-nourished female in no acute distress.  HEENT:  normocephalic HEART: normal rate RESP: normal effort ABDOMEN: Soft, non-tender, gravid appropriate for gestational age EXTREMITIES: Nontender, no edema NEURO: alert and oriented SPECULUM EXAM: NEFG, physiologic discharge, mild vaginal laceration after speculum exam, oozing mild amount of blood, cervix clean    FHT:  Baseline 140 , moderate variability, accelerations present, no decelerations Contractions: irregular   Labs: No results found for this or any previous visit (from the past 24 hour(s)).  Ferning neg Wet prep neg  Imaging:  US Ob Follow Up  06/04/2014   FOLLOW UP SONOGRAM   Cynthia Hood is in the office for a follow up sonogram for [facility]   She is a 28 y.o. year old G50P0040 with Estimated Date of Delivery: 08/01/14   now at  [redacted]w[redacted]d weeks gestation. Thus far the pregnancy has been complicated  by prev Ab x4, and 2VC.  GESTATION: SINGLETON  PRESENTATION: cephalic  FETAL ACTIVITY:          Heart rate         139 bpm  The fetus is active.  AMNIOTIC FLUID: The amniotic fluid volume is  normal, 17.6 cm SDP-6.2cm.  PLACENTA LOCALIZATION:  anterior GRADE 1  CERVIX: unable to view  ADNEXA: The adnexa appears normal.   GESTATIONAL AGE AND  BIOMETRICS:  Gestational criteria: Estimated Date of Delivery: 08/01/14  now at [redacted]w[redacted]d  Previous Scans:  GESTATIONAL SAC            mm          weeks  CROWN RUMP LENGTH            mm          weeks  NUCHAL TRANSLUCENCY            mm           BIPARIETAL DIAMETER           8.38 cm         33+5 weeks  HEAD CIRCUMFERENCE           30.55 cm         34+0 weeks  ABDOMINAL CIRCUMFERENCE           28.55 cm         32+4 weeks  FEMUR LENGTH           5.71 cm         30+0 weeks                                                           AVERAGE EGA(BY THIS SCAN):   32+4 weeks                                                 ESTIMATED FETAL WEIGHT:        1846  grams, 49 % ANATOMICAL SURVEY                                                                             COMMENTS  CEREBRAL VENTRICLES yes normal   CHOROID PLEXUS yes normal   CEREBELLUM yes normal   CISTERNA MAGNA yes normal   NUCHAL REGION yes normal   ORBITS yes normal   NASAL BONE yes normal   NOSE/LIP yes normal   FACIAL PROFILE yes normal   4 CHAMBERED HEART no    OUTFLOW TRACTS no    DIAPHRAGM yes normal   STOMACH yes normal   RENAL REGION no    BLADDER yes normal   CORD INSERTION no    3 VESSEL CORD no   2VC  SPINE no    ARMS/HANDS yes normal   LEGS/FEET no    GENITALIA yes normal female        SUSPECTED ABNORMALITIES:  yes  QUALITY OF SCAN: satisfactory  TECHNICIAN COMMENTS:  U/S(31+5wks)-vtx active fetus, approp growth EFW 4 lb 1 oz (49th%tile),  fluid WNL AFI-17.6cm SDP-6.2cm, FHR-139 bpm, female fetus, [place]  noted,   Anterior Gr 1 placenta    A copy of this report including all images has been saved and backed up to  a second source for retrieval if needed. All measures and details of the  anatomical scan, placentation, fluid volume and pelvic anatomy are  contained in that report.  Lazarus Gowda 06/04/2014 9:59 AM  Clinical Impression and recommendations:  I have reviewed the sonogram results above, combined with the patient's  current clinical course, below are my impressions and any appropriate  recommendations for management based on the sonographic findings.   1.  [id number] Estimated Date of Delivery: 08/01/14 by  early ultrasound and  confirmed by today's sonographic dating 2.  Normal fetal sonographic findings, specifically normal fluid and  appropriate growth 3.  2 vessel cord as previously noted  Recommend repeat growth scan at 36 weeks  EURE,LUTHER H 06/04/2014 10:15 AM        MAU Course:  Pt seen in MAU for r/o ROM. Her history was not convicing for rupture and on SSE she was not found to have pooling w/ negative ferning. Wet prep neg. No concern for ROM. Likely due to intercourse pt had earlier in the day.  Also noted during SSE was a mild amount of vaginal trauma during speculum placement. Pt was distressed  by bleeding as this reminded her of previous miscarriages. Reassured that bleeding was due to trauma and not related to fetus, pt reassured. Gave strict bleeding precautions.  Assessment: Vaginal Discharge  Plan: Discharge home Bleeding precautions Labor precautions and fetal kick counts    Medication List    Notice   Cannot display discharge medications because this is not an admission.      Josephine Cables, MD 06/26/2014 5:36 AM

## 2014-06-27 NOTE — OB Triage Provider Note (Signed)
Attestation of Attending Supervision of Fellow: Evaluation and management procedures were performed by the Fellow under my supervision and collaboration.  I have reviewed the Fellow's note and chart, and I agree with the management and plan.    

## 2014-07-03 ENCOUNTER — Other Ambulatory Visit: Payer: Self-pay | Admitting: Obstetrics & Gynecology

## 2014-07-03 ENCOUNTER — Encounter: Payer: Self-pay | Admitting: Obstetrics & Gynecology

## 2014-07-03 ENCOUNTER — Ambulatory Visit (INDEPENDENT_AMBULATORY_CARE_PROVIDER_SITE_OTHER): Payer: 59 | Admitting: Obstetrics & Gynecology

## 2014-07-03 ENCOUNTER — Ambulatory Visit (INDEPENDENT_AMBULATORY_CARE_PROVIDER_SITE_OTHER): Payer: 59

## 2014-07-03 VITALS — BP 120/80 | Wt 171.0 lb

## 2014-07-03 DIAGNOSIS — Q270 Congenital absence and hypoplasia of umbilical artery: Secondary | ICD-10-CM

## 2014-07-03 DIAGNOSIS — Z3480 Encounter for supervision of other normal pregnancy, unspecified trimester: Secondary | ICD-10-CM

## 2014-07-03 DIAGNOSIS — Z3483 Encounter for supervision of other normal pregnancy, third trimester: Secondary | ICD-10-CM

## 2014-07-03 DIAGNOSIS — Z1389 Encounter for screening for other disorder: Secondary | ICD-10-CM

## 2014-07-03 DIAGNOSIS — Z331 Pregnant state, incidental: Secondary | ICD-10-CM

## 2014-07-03 LAB — POCT URINALYSIS DIPSTICK
Glucose, UA: NEGATIVE
Ketones, UA: NEGATIVE
Leukocytes, UA: NEGATIVE
NITRITE UA: NEGATIVE
Protein, UA: NEGATIVE
RBC UA: NEGATIVE

## 2014-07-03 NOTE — Progress Notes (Signed)
U/S(35+6wks)-vtx active fetus, EFW 5 lb 9oz (27th%tile), fluid WNL AFI-12.0cm SDP-5.2cm, FHR-140 bpm, female fetus, 2VC noted, anterior Gr 2 placenta

## 2014-07-03 NOTE — Progress Notes (Signed)
Sonogram is reviewed and read and report done, normal fluid and growth, no further sonograms needed  G5P0040 [redacted]w[redacted]d Estimated Date of Delivery: 08/01/14  Blood pressure 120/80, weight 171 lb (77.565 kg), last menstrual period 10/25/2013.   BP weight and urine results all reviewed and noted.  Please refer to the obstetrical flow sheet for the fundal height and fetal heart rate documentation:  Patient reports good fetal movement, denies any bleeding and no rupture of membranes symptoms or regular contractions. Patient is without complaints. All questions were answered.  Plan:  Continued routine obstetrical care,   Follow up in 1 weeks for OB appointment, routine + GBS

## 2014-07-09 ENCOUNTER — Ambulatory Visit (INDEPENDENT_AMBULATORY_CARE_PROVIDER_SITE_OTHER): Payer: 59 | Admitting: Obstetrics & Gynecology

## 2014-07-09 ENCOUNTER — Encounter: Payer: Self-pay | Admitting: Obstetrics & Gynecology

## 2014-07-09 VITALS — BP 110/72 | Wt 172.0 lb

## 2014-07-09 DIAGNOSIS — Q270 Congenital absence and hypoplasia of umbilical artery: Secondary | ICD-10-CM

## 2014-07-09 DIAGNOSIS — Z3480 Encounter for supervision of other normal pregnancy, unspecified trimester: Secondary | ICD-10-CM

## 2014-07-09 DIAGNOSIS — O2620 Pregnancy care for patient with recurrent pregnancy loss, unspecified trimester: Secondary | ICD-10-CM

## 2014-07-09 DIAGNOSIS — Z1389 Encounter for screening for other disorder: Secondary | ICD-10-CM

## 2014-07-09 DIAGNOSIS — O2623 Pregnancy care for patient with recurrent pregnancy loss, third trimester: Secondary | ICD-10-CM

## 2014-07-09 DIAGNOSIS — O99891 Other specified diseases and conditions complicating pregnancy: Secondary | ICD-10-CM

## 2014-07-09 DIAGNOSIS — Z331 Pregnant state, incidental: Secondary | ICD-10-CM

## 2014-07-09 DIAGNOSIS — Z1159 Encounter for screening for other viral diseases: Secondary | ICD-10-CM

## 2014-07-09 DIAGNOSIS — IMO0001 Reserved for inherently not codable concepts without codable children: Secondary | ICD-10-CM

## 2014-07-09 DIAGNOSIS — O9989 Other specified diseases and conditions complicating pregnancy, childbirth and the puerperium: Secondary | ICD-10-CM

## 2014-07-09 DIAGNOSIS — Z3483 Encounter for supervision of other normal pregnancy, third trimester: Secondary | ICD-10-CM

## 2014-07-09 DIAGNOSIS — Z3685 Encounter for antenatal screening for Streptococcus B: Secondary | ICD-10-CM

## 2014-07-09 LAB — POCT URINALYSIS DIPSTICK
Glucose, UA: NEGATIVE
Ketones, UA: NEGATIVE
Leukocytes, UA: NEGATIVE
Nitrite, UA: NEGATIVE
Protein, UA: NEGATIVE
RBC UA: NEGATIVE

## 2014-07-09 LAB — OB RESULTS CONSOLE GC/CHLAMYDIA
Chlamydia: NEGATIVE
Gonorrhea: NEGATIVE

## 2014-07-09 NOTE — Progress Notes (Signed)
[id number] [redacted]w[redacted]d Estimated Date of Delivery: 08/01/14  Blood pressure 110/72, weight 172 lb (78.019 kg), last menstrual period 10/25/2013.   BP weight and urine results all reviewed and noted.  Please refer to the obstetrical flow sheet for the fundal height and fetal heart rate documentation:  Patient reports good fetal movement, denies any bleeding and no rupture of membranes symptoms or regular contractions. Patient is without complaints. All questions were answered.  Plan:  Continued routine obstetrical care,   Follow up in 1 weeks for OB appointment, routine

## 2014-07-09 NOTE — Progress Notes (Signed)
Pt denies any problems or concerns at this time.  

## 2014-07-10 LAB — GC/CHLAMYDIA PROBE AMP
CT Probe RNA: NEGATIVE
GC Probe RNA: NEGATIVE

## 2014-07-10 LAB — STREP B DNA PROBE: GBSP: DETECTED

## 2014-07-11 ENCOUNTER — Encounter: Payer: Self-pay | Admitting: Obstetrics & Gynecology

## 2014-07-18 ENCOUNTER — Encounter: Payer: 59 | Admitting: Obstetrics & Gynecology

## 2014-07-22 ENCOUNTER — Inpatient Hospital Stay (HOSPITAL_COMMUNITY)
Admission: AD | Admit: 2014-07-22 | Discharge: 2014-07-22 | Disposition: A | Discharge status: Home / Self Care | Payer: 59 | Source: Ambulatory Visit | Attending: Obstetrics & Gynecology | Admitting: Obstetrics & Gynecology

## 2014-07-22 ENCOUNTER — Encounter (HOSPITAL_COMMUNITY): Payer: Self-pay

## 2014-07-22 ENCOUNTER — Inpatient Hospital Stay (HOSPITAL_COMMUNITY)
Admission: AD | Admit: 2014-07-22 | Discharge: 2014-07-25 | DRG: 774 | Disposition: A | Discharge status: Home / Self Care | Payer: 59 | Source: Ambulatory Visit | Attending: Obstetrics & Gynecology | Admitting: Obstetrics & Gynecology

## 2014-07-22 DIAGNOSIS — O99892 Other specified diseases and conditions complicating childbirth: Principal | ICD-10-CM | POA: Diagnosis present

## 2014-07-22 DIAGNOSIS — Z8249 Family history of ischemic heart disease and other diseases of the circulatory system: Secondary | ICD-10-CM | POA: Diagnosis not present

## 2014-07-22 DIAGNOSIS — O99334 Smoking (tobacco) complicating childbirth: Secondary | ICD-10-CM | POA: Diagnosis present

## 2014-07-22 DIAGNOSIS — O479 False labor, unspecified: Secondary | ICD-10-CM | POA: Diagnosis present

## 2014-07-22 DIAGNOSIS — O480 Post-term pregnancy: Secondary | ICD-10-CM

## 2014-07-22 DIAGNOSIS — Z22330 Carrier of Group B streptococcus: Secondary | ICD-10-CM | POA: Diagnosis not present

## 2014-07-22 DIAGNOSIS — O98519 Other viral diseases complicating pregnancy, unspecified trimester: Secondary | ICD-10-CM | POA: Diagnosis present

## 2014-07-22 DIAGNOSIS — IMO0001 Reserved for inherently not codable concepts without codable children: Secondary | ICD-10-CM

## 2014-07-22 DIAGNOSIS — O9989 Other specified diseases and conditions complicating pregnancy, childbirth and the puerperium: Principal | ICD-10-CM

## 2014-07-22 DIAGNOSIS — A6000 Herpesviral infection of urogenital system, unspecified: Secondary | ICD-10-CM | POA: Diagnosis present

## 2014-07-22 DIAGNOSIS — Z833 Family history of diabetes mellitus: Secondary | ICD-10-CM | POA: Diagnosis not present

## 2014-07-22 DIAGNOSIS — Q270 Congenital absence and hypoplasia of umbilical artery: Secondary | ICD-10-CM

## 2014-07-22 DIAGNOSIS — Z3483 Encounter for supervision of other normal pregnancy, third trimester: Secondary | ICD-10-CM

## 2014-07-22 LAB — CBC
HCT: 38.4 % (ref 36.0–46.0)
Hemoglobin: 13.6 g/dL (ref 12.0–15.0)
MCH: 31.1 pg (ref 26.0–34.0)
MCHC: 35.4 g/dL (ref 30.0–36.0)
MCV: 87.9 fL (ref 78.0–100.0)
Platelets: 179 10*3/uL (ref 150–400)
RBC: 4.37 MIL/uL (ref 3.87–5.11)
RDW: 13.1 % (ref 11.5–15.5)
WBC: 16.5 10*3/uL — ABNORMAL HIGH (ref 4.0–10.5)

## 2014-07-22 MED ORDER — LACTATED RINGERS IV SOLN
500.0000 mL | INTRAVENOUS | Status: DC | PRN
Start: 2014-07-22 — End: 2014-07-23

## 2014-07-22 MED ORDER — ONDANSETRON HCL 4 MG/2ML IJ SOLN
4.0000 mg | Freq: Four times a day (QID) | INTRAMUSCULAR | Status: DC | PRN
  Administered 2014-07-23: 4 mg via INTRAVENOUS
  Filled 2014-07-22: qty 2

## 2014-07-22 MED ORDER — LACTATED RINGERS IV SOLN
INTRAVENOUS | Status: DC
  Administered 2014-07-22 – 2014-07-23 (×4): via INTRAVENOUS

## 2014-07-22 MED ORDER — ACYCLOVIR 400 MG PO TABS
400.0000 mg | ORAL_TABLET | Freq: Three times a day (TID) | ORAL | Status: DC
  Administered 2014-07-22 – 2014-07-23 (×3): 400 mg via ORAL
  Filled 2014-07-22 (×5): qty 1

## 2014-07-22 MED ORDER — OXYTOCIN BOLUS FROM INFUSION
500.0000 mL | INTRAVENOUS | Status: DC
  Administered 2014-07-23: 500 mL via INTRAVENOUS

## 2014-07-22 MED ORDER — OXYTOCIN 40 UNITS IN LACTATED RINGERS INFUSION - SIMPLE MED
62.5000 mL/h | INTRAVENOUS | Status: DC
  Administered 2014-07-23: 62.5 mL/h via INTRAVENOUS
  Filled 2014-07-22: qty 1000

## 2014-07-22 MED ORDER — PENICILLIN G POTASSIUM 5000000 UNITS IJ SOLR
5.0000 10*6.[IU] | Freq: Once | INTRAVENOUS | Status: AC
  Administered 2014-07-22: 5 10*6.[IU] via INTRAVENOUS
  Filled 2014-07-22: qty 5

## 2014-07-22 MED ORDER — FENTANYL CITRATE 0.05 MG/ML IJ SOLN: 100.0000 ug | INTRAMUSCULAR | Status: DC | PRN

## 2014-07-22 MED ORDER — ACETAMINOPHEN 325 MG PO TABS: 650.0000 mg | ORAL_TABLET | ORAL | Status: DC | PRN

## 2014-07-22 MED ORDER — CITRIC ACID-SODIUM CITRATE 334-500 MG/5ML PO SOLN
30.0000 mL | ORAL | Status: DC | PRN
  Administered 2014-07-23: 30 mL via ORAL
  Filled 2014-07-22: qty 15

## 2014-07-22 MED ORDER — LIDOCAINE HCL (PF) 1 % IJ SOLN
30.0000 mL | INTRAMUSCULAR | Status: DC | PRN
  Filled 2014-07-22: qty 30

## 2014-07-22 MED ORDER — PENICILLIN G POTASSIUM 5000000 UNITS IJ SOLR
2.5000 10*6.[IU] | INTRAVENOUS | Status: DC
  Administered 2014-07-23 (×5): 2.5 10*6.[IU] via INTRAVENOUS
  Filled 2014-07-22 (×7): qty 2.5

## 2014-07-22 MED ORDER — OXYCODONE-ACETAMINOPHEN 5-325 MG PO TABS: 1.0000 | ORAL_TABLET | ORAL | Status: DC | PRN

## 2014-07-22 MED ORDER — IBUPROFEN 600 MG PO TABS: 600.0000 mg | ORAL_TABLET | Freq: Four times a day (QID) | ORAL | Status: DC | PRN

## 2014-07-22 NOTE — MAU Note (Signed)
Pt presents to MAU with complaints of contractions that started about 45 mins ago. Denies any LOF, reports some vaginal spotting

## 2014-07-22 NOTE — H&P (Signed)
ZIYONNA CHRISTNER is a 28 y.o. female [id number] with IUP at [redacted]w[redacted]d presenting for active labor. Pt states she has been having regular, every 5 minutes contractions, associated with spotting vaginal bleeding.  Membranes are intact, with active fetal movement.    [facility] at [facility] since 7 wks  Prenatal History/Complications:   prev Ab x 4 and 2VC (Lt absent)  On ASA for possible clotting disorder  Past Medical History: Past Medical History  Diagnosis Date  . Ectopic pregnancy   . Colitis, ulcerative     Past Surgical History: Past Surgical History  Procedure Laterality Date  . No past surgeries      Obstetrical History: OB History   Grav Para Term Preterm Abortions TAB SAB Ect Mult Living   5    4  4          Social History: History   Social History  . Marital Status: Single    Spouse Name: N/A    Number of Children: N/A  . Years of Education: N/A   Social History Main Topics  . Smoking status: Light Tobacco Smoker -- 0.50 packs/day for 12 years    Types: Cigarettes  . Smokeless tobacco: Never Used  . Alcohol Use: No     Comment: occ  . Drug Use: No  . Sexual Activity: Yes    Birth Control/ Protection: None   Other Topics Concern  . None   Social History Narrative  . None    Family History: Family History  Problem Relation Age of Onset  . Cancer Other   . Diabetes Father   . Hypertension Father   . Heart disease Father     Allergies: No Known Allergies  Prescriptions prior to admission  Medication Sig Dispense Refill  . acyclovir (ZOVIRAX) 400 MG tablet Take 1 tablet (400 mg total) by mouth 3 (three) times daily.  90 tablet  3  . aspirin 81 MG tablet Take 81 mg by mouth daily.      . calcium carbonate (TUMS - DOSED IN MG ELEMENTAL CALCIUM) 500 MG chewable tablet Chew 2 tablets by mouth daily as needed for indigestion or heartburn.      . Prenatal Vit-Fe Fumarate-FA (PRENATAL MULTIVITAMIN) TABS tablet Take 1 tablet by mouth daily.         Review of  Systems: Negative unless otherwise stated in History above  Physicial Blood pressure 133/80, pulse 104, temperature 98 F (36.7 C), temperature source Oral, last menstrual period 10/25/2013. General appearance: alert, cooperative and no distress Lungs: clear to auscultation bilaterally Heart: regular rate and rhythm Abdomen: soft, non-tender; bowel sounds normal Extremities: Homans sign is negative, no sign of DVT Presentation: cephalic Fetal monitoringBaseline: 125 bpm, Variability: Good {> 6 bpm), Accelerations: Reactive and Decelerations: Absent Uterine activityFrequency: Every 4 minutes Dilation: 4.5 Effacement (%): 80 Station: -2 Exam by:: Glenford Peers RN  Prenatal labs: ABO, Rh: O/POS/-- (01/26 1519) Antibody: NEG (06/19 0929) Rubella:   RPR: NON REAC (06/19 0929)  HBsAg: NEGATIVE (01/26 1519)  HIV: NONREACTIVE (06/19 0929)  GBS: Detected (08/17 1022)  GTT: wnl  No results found for this or any previous visit (from the past 24 hour(s)).  Assessment: MADDY GRAHAM is a 28 y.o. G5P0040 at [redacted]w[redacted]d by here for SOL. Hx of 4 SAB; Umbilical cord = 2 vessel   #Labor: admit to birthing suite; expectant management #Pain: Fentanyl  #FWB: Cat 1 #ID:  GBS +; PCN     HSV-2, No current leison. On Acyclovir  #  Feeding: Breast #MOC: Unsure; considering LARC #Circ:  Yes @ [place] MD Zacarias Pontes FM PGY-2 07/22/2014, 10:42 PM  I examined pt and agree with documentation above and resident plan of care. Venia Carbon Michiel Cowboy, CNM

## 2014-07-22 NOTE — MAU Note (Signed)
Pt. reports that contractions are approximately three minutes apart and increasing in intensity.

## 2014-07-22 NOTE — Progress Notes (Signed)
NOtified of pt arrival in MAU and cervical exam and pain level. Will go see pt

## 2014-07-22 NOTE — Discharge Instructions (Signed)
Braxton Hicks Contractions Contractions of the uterus can occur throughout pregnancy. Contractions are not always a sign that you are in labor.  WHAT ARE BRAXTON HICKS CONTRACTIONS?  Contractions that occur before labor are called Braxton Hicks contractions, or false labor. Toward the end of pregnancy (32-34 weeks), these contractions can develop more often and may become more forceful. This is not true labor because these contractions do not result in opening (dilatation) and thinning of the cervix. They are sometimes difficult to tell apart from true labor because these contractions can be forceful and people have different pain tolerances. You should not feel embarrassed if you go to the hospital with false labor. Sometimes, the only way to tell if you are in true labor is for your health care provider to look for changes in the cervix. If there are no prenatal problems or other health problems associated with the pregnancy, it is completely safe to be sent home with false labor and await the onset of true labor. HOW CAN YOU TELL THE DIFFERENCE BETWEEN TRUE AND FALSE LABOR? False Labor  The contractions of false labor are usually shorter and not as hard as those of true labor.   The contractions are usually irregular.   The contractions are often felt in the front of the lower abdomen and in the groin.   The contractions may go away when you walk around or change positions while lying down.   The contractions get weaker and are shorter lasting as time goes on.   The contractions do not usually become progressively stronger, regular, and closer together as with true labor.  True Labor  Contractions in true labor last 30-70 seconds, become very regular, usually become more intense, and increase in frequency.   The contractions do not go away with walking.   The discomfort is usually felt in the top of the uterus and spreads to the lower abdomen and low back.   True labor can be  determined by your health care provider with an exam. This will show that the cervix is dilating and getting thinner.  WHAT TO REMEMBER  Keep up with your usual exercises and follow other instructions given by your health care provider.   Take medicines as directed by your health care provider.   Keep your regular prenatal appointments.   Eat and drink lightly if you think you are going into labor.   If Braxton Hicks contractions are making you uncomfortable:   Change your position from lying down or resting to walking, or from walking to resting.   Sit and rest in a tub of warm water.   Drink 2-3 glasses of water. Dehydration may cause these contractions.   Do slow and deep breathing several times an hour.  WHEN SHOULD I SEEK IMMEDIATE MEDICAL CARE? Seek immediate medical care if:  Your contractions become stronger, more regular, and closer together.   You have fluid leaking or gushing from your vagina.   You have a fever.   You pass blood-tinged mucus.   You have vaginal bleeding.   You have continuous abdominal pain.   You have low back pain that you never had before.   You feel your baby's head pushing down and causing pelvic pressure.   Your baby is not moving as much as it used to.  Document Released: 11/09/2005 Document Revised: 11/14/2013 Document Reviewed: 08/21/2013 Exit[employer] Patient Information 2015 [employer]. This information is not intended to replace advice given to you by your health care   provider. Make sure you discuss any questions you have with your health care provider.  Fetal Movement Counts Patient Name: __________________________________________________ Patient Due Date: ____________________ Performing a fetal movement count is highly recommended in high-risk pregnancies, but it is good for every pregnant woman to do. Your health care provider may ask you to start counting fetal movements at 28 weeks of the pregnancy. Fetal  movements often increase:  After eating a full meal.  After physical activity.  After eating or drinking something sweet or cold.  At rest. Pay attention to when you feel the baby is most active. This will help you notice a pattern of your baby's sleep and wake cycles and what factors contribute to an increase in fetal movement. It is important to perform a fetal movement count at the same time each day when your baby is normally most active.  HOW TO COUNT FETAL MOVEMENTS 1. Find a quiet and comfortable area to sit or lie down on your left side. Lying on your left side provides the best blood and oxygen circulation to your baby. 2. Write down the day and time on a sheet of paper or in a journal. 3. Start counting kicks, flutters, swishes, rolls, or jabs in a 2-hour period. You should feel at least 10 movements within 2 hours. 4. If you do not feel 10 movements in 2 hours, wait 2-3 hours and count again. Look for a change in the pattern or not enough counts in 2 hours. SEEK MEDICAL CARE IF:  You feel less than 10 counts in 2 hours, tried twice.  There is no movement in over an hour.  The pattern is changing or taking longer each day to reach 10 counts in 2 hours.  You feel the baby is not moving as he or she usually does. Date: ____________ Movements: ____________ Start time: ____________ Finish time: ____________  Date: ____________ Movements: ____________ Start time: ____________ Finish time: ____________ Date: ____________ Movements: ____________ Start time: ____________ Finish time: ____________ Date: ____________ Movements: ____________ Start time: ____________ Finish time: ____________ Date: ____________ Movements: ____________ Start time: ____________ Finish time: ____________ Date: ____________ Movements: ____________ Start time: ____________ Finish time: ____________ Date: ____________ Movements: ____________ Start time: ____________ Finish time: ____________ Date: ____________  Movements: ____________ Start time: ____________ Finish time: ____________  Date: ____________ Movements: ____________ Start time: ____________ Finish time: ____________ Date: ____________ Movements: ____________ Start time: ____________ Finish time: ____________ Date: ____________ Movements: ____________ Start time: ____________ Finish time: ____________ Date: ____________ Movements: ____________ Start time: ____________ Finish time: ____________ Date: ____________ Movements: ____________ Start time: ____________ Finish time: ____________ Date: ____________ Movements: ____________ Start time: ____________ Finish time: ____________ Date: ____________ Movements: ____________ Start time: ____________ Finish time: ____________  Date: ____________ Movements: ____________ Start time: ____________ Finish time: ____________ Date: ____________ Movements: ____________ Start time: ____________ Finish time: ____________ Date: ____________ Movements: ____________ Start time: ____________ Finish time: ____________ Date: ____________ Movements: ____________ Start time: ____________ Finish time: ____________ Date: ____________ Movements: ____________ Start time: ____________ Finish time: ____________ Date: ____________ Movements: ____________ Start time: ____________ Finish time: ____________ Date: ____________ Movements: ____________ Start time: ____________ Finish time: ____________  Date: ____________ Movements: ____________ Start time: ____________ Finish time: ____________ Date: ____________ Movements: ____________ Start time: ____________ Finish time: ____________ Date: ____________ Movements: ____________ Start time: ____________ Finish time: ____________ Date: ____________ Movements: ____________ Start time: ____________ Finish time: ____________ Date: ____________ Movements: ____________ Start time: ____________ Finish time: ____________ Date: ____________ Movements: ____________ Start time:  ____________ Finish time: ____________ Date: ____________ Movements:   ____________ Start time: ____________ Finish time: ____________  Date: ____________ Movements: ____________ Start time: ____________ Finish time: ____________ Date: ____________ Movements: ____________ Start time: ____________ Finish time: ____________ Date: ____________ Movements: ____________ Start time: ____________ Finish time: ____________ Date: ____________ Movements: ____________ Start time: ____________ Finish time: ____________ Date: ____________ Movements: ____________ Start time: ____________ Finish time: ____________ Date: ____________ Movements: ____________ Start time: ____________ Finish time: ____________ Date: ____________ Movements: ____________ Start time: ____________ Finish time: ____________  Date: ____________ Movements: ____________ Start time: ____________ Finish time: ____________ Date: ____________ Movements: ____________ Start time: ____________ Finish time: ____________ Date: ____________ Movements: ____________ Start time: ____________ Finish time: ____________ Date: ____________ Movements: ____________ Start time: ____________ Finish time: ____________ Date: ____________ Movements: ____________ Start time: ____________ Finish time: ____________ Date: ____________ Movements: ____________ Start time: ____________ Finish time: ____________ Date: ____________ Movements: ____________ Start time: ____________ Finish time: ____________  Date: ____________ Movements: ____________ Start time: ____________ Finish time: ____________ Date: ____________ Movements: ____________ Start time: ____________ Finish time: ____________ Date: ____________ Movements: ____________ Start time: ____________ Finish time: ____________ Date: ____________ Movements: ____________ Start time: ____________ Finish time: ____________ Date: ____________ Movements: ____________ Start time: ____________ Finish time: ____________ Date:  ____________ Movements: ____________ Start time: ____________ Finish time: ____________ Date: ____________ Movements: ____________ Start time: ____________ Finish time: ____________  Date: ____________ Movements: ____________ Start time: ____________ Finish time: ____________ Date: ____________ Movements: ____________ Start time: ____________ Finish time: ____________ Date: ____________ Movements: ____________ Start time: ____________ Finish time: ____________ Date: ____________ Movements: ____________ Start time: ____________ Finish time: ____________ Date: ____________ Movements: ____________ Start time: ____________ Finish time: ____________ Date: ____________ Movements: ____________ Start time: ____________ Finish time: ____________ Document Released: 12/09/2006 Document Revised: 03/26/2014 Document Reviewed: 09/05/2012 [employer] Patient Information 2015 [employer]. This information is not intended to replace advice given to you by your health care provider. Make sure you discuss any questions you have with your health care provider.  

## 2014-07-23 ENCOUNTER — Encounter (HOSPITAL_COMMUNITY): Payer: 59 | Admitting: Anesthesiology

## 2014-07-23 ENCOUNTER — Encounter (HOSPITAL_COMMUNITY): Payer: Self-pay | Admitting: Obstetrics and Gynecology

## 2014-07-23 ENCOUNTER — Encounter: Payer: 59 | Admitting: Obstetrics & Gynecology

## 2014-07-23 ENCOUNTER — Inpatient Hospital Stay (HOSPITAL_COMMUNITY): Payer: 59 | Admitting: Anesthesiology

## 2014-07-23 DIAGNOSIS — O99892 Other specified diseases and conditions complicating childbirth: Secondary | ICD-10-CM

## 2014-07-23 DIAGNOSIS — O99334 Smoking (tobacco) complicating childbirth: Secondary | ICD-10-CM

## 2014-07-23 DIAGNOSIS — O9989 Other specified diseases and conditions complicating pregnancy, childbirth and the puerperium: Secondary | ICD-10-CM

## 2014-07-23 DIAGNOSIS — A6000 Herpesviral infection of urogenital system, unspecified: Secondary | ICD-10-CM

## 2014-07-23 DIAGNOSIS — O98519 Other viral diseases complicating pregnancy, unspecified trimester: Secondary | ICD-10-CM

## 2014-07-23 LAB — RPR

## 2014-07-23 MED ORDER — DIPHENHYDRAMINE HCL 25 MG PO CAPS: 25.0000 mg | ORAL_CAPSULE | Freq: Four times a day (QID) | ORAL | Status: DC | PRN

## 2014-07-23 MED ORDER — ONDANSETRON HCL 4 MG PO TABS
4.0000 mg | ORAL_TABLET | ORAL | Status: DC | PRN
  Administered 2014-07-25: 4 mg via ORAL
  Filled 2014-07-23: qty 1

## 2014-07-23 MED ORDER — LANOLIN HYDROUS EX OINT: TOPICAL_OINTMENT | CUTANEOUS | Status: DC | PRN

## 2014-07-23 MED ORDER — BENZOCAINE-MENTHOL 20-0.5 % EX AERO
1.0000 "application " | INHALATION_SPRAY | CUTANEOUS | Status: DC | PRN
  Administered 2014-07-24: 1 via TOPICAL
  Filled 2014-07-23: qty 56

## 2014-07-23 MED ORDER — LACTATED RINGERS IV SOLN
500.0000 mL | Freq: Once | INTRAVENOUS | Status: AC
  Administered 2014-07-23: 500 mL via INTRAVENOUS

## 2014-07-23 MED ORDER — EPHEDRINE 5 MG/ML INJ: 10.0000 mg | INTRAVENOUS | Status: DC | PRN

## 2014-07-23 MED ORDER — TERBUTALINE SULFATE 1 MG/ML IJ SOLN: 0.2500 mg | Freq: Once | INTRAMUSCULAR | Status: DC | PRN

## 2014-07-23 MED ORDER — DIBUCAINE 1 % RE OINT: 1.0000 "application " | TOPICAL_OINTMENT | RECTAL | Status: DC | PRN

## 2014-07-23 MED ORDER — PHENYLEPHRINE 40 MCG/ML (10ML) SYRINGE FOR IV PUSH (FOR BLOOD PRESSURE SUPPORT): 80.0000 ug | PREFILLED_SYRINGE | INTRAVENOUS | Status: DC | PRN

## 2014-07-23 MED ORDER — SENNOSIDES-DOCUSATE SODIUM 8.6-50 MG PO TABS
2.0000 | ORAL_TABLET | ORAL | Status: DC
  Administered 2014-07-24 (×2): 2 via ORAL
  Filled 2014-07-23 (×2): qty 2

## 2014-07-23 MED ORDER — PRENATAL MULTIVITAMIN CH
1.0000 | ORAL_TABLET | Freq: Every day | ORAL | Status: DC
  Administered 2014-07-24: 1 via ORAL
  Filled 2014-07-23: qty 1

## 2014-07-23 MED ORDER — ZOLPIDEM TARTRATE 5 MG PO TABS: 5.0000 mg | ORAL_TABLET | Freq: Every evening | ORAL | Status: DC | PRN

## 2014-07-23 MED ORDER — MISOPROSTOL 200 MCG PO TABS
ORAL_TABLET | ORAL | Status: AC
  Filled 2014-07-23: qty 5

## 2014-07-23 MED ORDER — FAMOTIDINE IN NACL 20-0.9 MG/50ML-% IV SOLN
20.0000 mg | Freq: Once | INTRAVENOUS | Status: AC
Start: 2014-07-23 — End: 2014-07-23
  Administered 2014-07-23: 20 mg via INTRAVENOUS
  Filled 2014-07-23: qty 50

## 2014-07-23 MED ORDER — DIPHENHYDRAMINE HCL 50 MG/ML IJ SOLN: 12.5000 mg | INTRAMUSCULAR | Status: DC | PRN

## 2014-07-23 MED ORDER — ONDANSETRON HCL 4 MG/2ML IJ SOLN: 4.0000 mg | INTRAMUSCULAR | Status: DC | PRN

## 2014-07-23 MED ORDER — OXYTOCIN 40 UNITS IN LACTATED RINGERS INFUSION - SIMPLE MED
1.0000 m[IU]/min | INTRAVENOUS | Status: DC
  Administered 2014-07-23: 2 m[IU]/min via INTRAVENOUS

## 2014-07-23 MED ORDER — LIDOCAINE HCL (PF) 1 % IJ SOLN
INTRAMUSCULAR | Status: DC | PRN
  Administered 2014-07-23 (×4): 4 mL

## 2014-07-23 MED ORDER — IBUPROFEN 600 MG PO TABS
600.0000 mg | ORAL_TABLET | Freq: Four times a day (QID) | ORAL | Status: DC
  Administered 2014-07-24 – 2014-07-25 (×6): 600 mg via ORAL
  Filled 2014-07-23 (×6): qty 1

## 2014-07-23 MED ORDER — FENTANYL 2.5 MCG/ML BUPIVACAINE 1/10 % EPIDURAL INFUSION (WH - ANES)
14.0000 mL/h | INTRAMUSCULAR | Status: DC | PRN
  Administered 2014-07-23 (×2): 14 mL/h via EPIDURAL
  Filled 2014-07-23: qty 125

## 2014-07-23 MED ORDER — PHENYLEPHRINE 40 MCG/ML (10ML) SYRINGE FOR IV PUSH (FOR BLOOD PRESSURE SUPPORT)
PREFILLED_SYRINGE | INTRAVENOUS | Status: AC
  Filled 2014-07-23: qty 10

## 2014-07-23 MED ORDER — WITCH HAZEL-GLYCERIN EX PADS: 1.0000 "application " | MEDICATED_PAD | CUTANEOUS | Status: DC | PRN

## 2014-07-23 MED ORDER — OXYCODONE-ACETAMINOPHEN 5-325 MG PO TABS: 1.0000 | ORAL_TABLET | ORAL | Status: DC | PRN

## 2014-07-23 MED ORDER — SIMETHICONE 80 MG PO CHEW: 80.0000 mg | CHEWABLE_TABLET | ORAL | Status: DC | PRN

## 2014-07-23 MED ORDER — FENTANYL 2.5 MCG/ML BUPIVACAINE 1/10 % EPIDURAL INFUSION (WH - ANES)
INTRAMUSCULAR | Status: AC
  Administered 2014-07-23: 14 mL/h via EPIDURAL
  Filled 2014-07-23: qty 125

## 2014-07-23 MED ORDER — MISOPROSTOL 200 MCG PO TABS
1000.0000 ug | ORAL_TABLET | Freq: Once | ORAL | Status: AC
  Administered 2014-07-23: 200 ug via RECTAL

## 2014-07-23 MED ORDER — NALBUPHINE HCL 10 MG/ML IJ SOLN
10.0000 mg | INTRAMUSCULAR | Status: DC | PRN
  Administered 2014-07-23 (×2): 10 mg via INTRAVENOUS
  Filled 2014-07-23 (×2): qty 1

## 2014-07-23 MED ORDER — TETANUS-DIPHTH-ACELL PERTUSSIS 5-2.5-18.5 LF-MCG/0.5 IM SUSP
0.5000 mL | Freq: Once | INTRAMUSCULAR | Status: AC
  Administered 2014-07-24: 0.5 mL via INTRAMUSCULAR
  Filled 2014-07-23: qty 0.5

## 2014-07-23 NOTE — Anesthesia Preprocedure Evaluation (Signed)
Anesthesia Evaluation  Patient identified by MRN, date of birth, ID band Patient awake    Reviewed: Allergy & Precautions, H&P , NPO status , Patient's Chart, lab work & pertinent test results, reviewed documented beta blocker date and time   History of Anesthesia Complications Negative for: history of anesthetic complications  Airway Mallampati: III TM Distance: >3 FB Neck ROM: full    Dental  (+) Teeth Intact   Pulmonary Current Smoker,  breath sounds clear to auscultation        Cardiovascular negative cardio ROS  Rhythm:regular Rate:Normal     Neuro/Psych negative neurological ROS  negative psych ROS   GI/Hepatic Neg liver ROS, Ulcerative colitis   Endo/Other  negative endocrine ROS  Renal/GU negative Renal ROS     Musculoskeletal   Abdominal   Peds  Hematology negative hematology ROS (+)   Anesthesia Other Findings   Reproductive/Obstetrics (+) Pregnancy                           Anesthesia Physical Anesthesia Plan  ASA: II  Anesthesia Plan: Epidural   Post-op Pain Management:    Induction:   Airway Management Planned:   Additional Equipment:   Intra-op Plan:   Post-operative Plan:   Informed Consent: I have reviewed the patients History and Physical, chart, labs and discussed the procedure including the risks, benefits and alternatives for the proposed anesthesia with the patient or authorized representative who has indicated his/her understanding and acceptance.     Plan Discussed with:   Anesthesia Plan Comments:         Anesthesia Quick Evaluation

## 2014-07-23 NOTE — Progress Notes (Signed)
Cynthia Hood is a 28 y.o. G5P0040 at [redacted]w[redacted]d admitted for active labor  Subjective: Doing well; No complaints, epidural in place  Objective: BP 118/70  Pulse 98  Temp(Src) 97.4 F (36.3 C) (Oral)  Resp 18  Ht 5\' 2"  (1.575 m)  Wt 177 lb (80.287 kg)  BMI 32.37 kg/m2  SpO2 98%  LMP 10/25/2013   Total I/O In: -  Out: 500 [Urine:500] FHT:  FHR: 125 bpm, variability: moderate,  accelerations:  Present,  decelerations:  1 variable UC:   regular, every 5 minutes SVE:   Dilation: 7 Effacement (%): 90 Station: -1;0 Exam by:: Dr. Deniece Ree  Labs: Lab Results  Component Value Date   WBC 16.5* 07/22/2014   HGB 13.6 07/22/2014   HCT 38.4 07/22/2014   MCV 87.9 07/22/2014   PLT 179 07/22/2014    Assessment / Plan: Spontaneous labor, progressing normally  Labor: Progressing normally, AROM at 9417 clear, no complications.  Would like minimal interventions and would prefer to not have pitocin Preeclampsia:  no signs or symptoms of toxicity Fetal Wellbeing:  Category I Pain Control:  epidural I/D:  GBS +; PCN     HSV-2, No current leison. On Acyclovir   Anticipated MOD:  NSVD  Cynthia Hood 07/23/2014, 11:41 AM

## 2014-07-23 NOTE — Progress Notes (Signed)
Cynthia Hood is a 28 y.o. G5P0040 at [redacted]w[redacted]d admitted for active labor  Subjective: Doing well; No complaints  Objective: BP 110/58  Pulse 82  Temp(Src) 97.6 F (36.4 C) (Oral)  Resp 18  Ht 5\' 2"  (1.575 m)  Wt 80.287 kg (177 lb)  BMI 32.37 kg/m2  LMP 10/25/2013     FHT:  FHR: 125 bpm, variability: moderate,  accelerations:  Present,  decelerations:  Absent UC:   regular, every 5 minutes SVE:   Dilation: 6 Effacement (%): 90 Station: -1 Exam by:: Toy Cookey, RN  Labs: Lab Results  Component Value Date   WBC 16.5* 07/22/2014   HGB 13.6 07/22/2014   HCT 38.4 07/22/2014   MCV 87.9 07/22/2014   PLT 179 07/22/2014    Assessment / Plan: Spontaneous labor, progressing normally  Labor: Progressing normally Preeclampsia:  no signs or symptoms of toxicity Fetal Wellbeing:  Category I Pain Control:  Nubain I/D:  GBS +; PCN     HSV-2, No current leison. On Acyclovir   Anticipated MOD:  NSVD  Phill Myron 07/23/2014, 6:48 AM

## 2014-07-23 NOTE — Progress Notes (Signed)
Report received, care assumed.

## 2014-07-23 NOTE — Anesthesia Procedure Notes (Signed)
Epidural Patient location during procedure: OB Start time: 07/23/2014 8:08 AM  Staffing Anesthesiologist: Anner Baity Performed by: anesthesiologist   Preanesthetic Checklist Completed: patient identified, site marked, surgical consent, pre-op evaluation, timeout performed, IV checked, risks and benefits discussed and monitors and equipment checked  Epidural Patient position: sitting Prep: site prepped and draped and DuraPrep Patient monitoring: continuous pulse ox and blood pressure Approach: midline Location: L3-L4 Injection technique: LOR air  Needle:  Needle type: Tuohy  Needle gauge: 17 G Needle length: 9 cm and 9 Needle insertion depth: 5.5 cm Catheter type: closed end flexible Catheter size: 19 Gauge Catheter at skin depth: 10.5 cm Test dose: negative  Assessment Events: blood not aspirated, injection not painful, no injection resistance, negative IV test and no paresthesia  Additional Notes Discussed risk of headache, infection, bleeding, nerve injury and failed or incomplete block.  Patient voices understanding and wishes to proceed.  Epidural placed easily on first attempt.  No paresthesia.  Patient tolerated procedure well with no apparent complications.  Charlton Haws, MDReason for block:procedure for pain

## 2014-07-23 NOTE — Progress Notes (Signed)
Delivery of live viable female by Dr. Angelica Chessman at 2037.

## 2014-07-23 NOTE — Progress Notes (Signed)
LABOR PROGRESS NOTE  Cynthia Hood is a 28 y.o. G5P0040 at [redacted]w[redacted]d  admitted for active labor  Subjective: Comfortable with epidural but starting to feel rectal pressure.   Objective: BP 111/60  Pulse 106  Temp(Src) 99.2 F (37.3 C) (Oral)  Resp 18  Ht 5\' 2"  (1.575 m)  Wt 177 lb (80.287 kg)  BMI 32.37 kg/m2  SpO2 98%  LMP 10/25/2013 or  Filed Vitals:   07/23/14 1500 07/23/14 1530 07/23/14 1600 07/23/14 1630  BP: 131/90 132/71 117/69 111/60  Pulse: 89 90 91 106  Temp:   99.2 F (37.3 C)   TempSrc:   Oral   Resp:   18   Height:      Weight:      SpO2:        Total I/O In: -  Out: 1100 [Urine:1100]  FHT:  FHR: 120 bpm, variability: moderate,  accelerations:  Present,  decelerations:  Absent UC:   regular, every 2-3 minutes SVE:   Dilation: 8.5 Effacement (%): 100 Station: 0;+1 Exam by:: Ruben Im RNC  Dilation: 8.5 Effacement (%): 100 Cervical Position: Anterior Station: 0;+1 Presentation: Vertex Exam by:: Ruben Im RNC  SVE 9.5/100/1  Labs: Lab Results  Component Value Date   WBC 16.5* 07/22/2014   HGB 13.6 07/22/2014   HCT 38.4 07/22/2014   MCV 87.9 07/22/2014   PLT 179 07/22/2014    Assessment / Plan: Augmentation of labor, progressing well.   Labor: Progressing normally.  Fetal Wellbeing:  Category I Pain Control:  Epidural Anticipated MOD:  NSVD  Shelbie Hutching, MD 07/23/2014, 5:07 PM

## 2014-07-24 ENCOUNTER — Encounter (HOSPITAL_COMMUNITY): Payer: Self-pay

## 2014-07-24 NOTE — Lactation Note (Signed)
This note was copied from the chart of Cynthia Hood. Lactation Consultation Note  Patient Name: Cynthia Hood JQGBE'E Date: 07/24/2014 Reason for consult: Initial assessment Per mom the baby has been to the breast , but I don't know if he is getting enough He has been sleepy. LC reviewed the doc flow sheets with parents and for baby being <24  Hours is doing well .Baby awake , LC changed wet diaper and mec stool. Per dad the Baby had a pee at delivery ( small amount ), and when the baby was placed on mom had a small pee. Renningers assisted with latch in football position , worked on depth and positioning. Latch obtained , multiply swallows noted, Increased with breast compressions. Baby fed on the right breast 10 mins and re-latched for 8 mins. Per mom  Feeding was comfort able. Due to mom mentioning she has tender nipples , instructed on the use comfort gels, And due to areola edema , instructed mom on the use breast shells, hand pump. Lactation Plan of Care - ( discussed with mom and dad ) , shells between feedings Prior to latch - breast massage , hand express, pre - pump to make the nipple and areola more elastic and latch with firm support And use breast compressions until the baby is in a consistent swallowing pattern and then intermittent.  Mom and dad receptive to teaching and plan , MBU RN aware of plan also. Mother informed of post-discharge support and given phone number to the lactation department, including services for phone call assistance;  out-patient appointments; and breastfeeding support group. List of other breastfeeding resources in the community given in the handout.  Encouraged mother to call for problems or concerns related to breastfeeding.   Maternal Data Has patient been taught Hand Expression?: Yes  Feeding Feeding Type: Breast Fed Length of feed: 8 min  LATCH Score/Interventions Latch: Grasps breast easily, tongue down, lips flanged, rhythmical  sucking. Intervention(s): Adjust position;Assist with latch;Breast massage;Breast compression  Audible Swallowing: Spontaneous and intermittent  Type of Nipple: Everted at rest and after stimulation  Comfort (Breast/Nipple): Soft / non-tender     Hold (Positioning): Assistance needed to correctly position infant at breast and maintain latch. Intervention(s): Breastfeeding basics reviewed;Support Pillows;Position options;Skin to skin  LATCH Score: 9  Lactation Tools Discussed/Used Tools: Shells;Pump;Comfort gels Shell Type: Inverted Breast pump type: Manual Pump Review: Setup, frequency, and cleaning;Milk Storage Initiated by:: mai  Date initiated:: 07/24/14   Consult Status Consult Status: Follow-up Date: 07/25/14 Follow-up type: In-patient    Myer Haff 07/24/2014, 3:10 PM

## 2014-07-24 NOTE — Anesthesia Postprocedure Evaluation (Signed)
Anesthesia Post Note  Patient: Cynthia Hood  Procedure(s) Performed: * No procedures listed *  Anesthesia type: Epidural  Patient location: Mother/Baby  Post pain: Pain level controlled  Post assessment: Post-op Vital signs reviewed  Last Vitals:  Filed Vitals:   07/24/14 0610  BP: 114/76  Pulse: 91  Temp: 36.9 C  Resp: 20    Post vital signs: Reviewed  Level of consciousness:alert  Complications: No apparent anesthesia complications

## 2014-07-25 MED ORDER — IBUPROFEN 600 MG PO TABS: 600.0000 mg | ORAL_TABLET | Freq: Four times a day (QID) | ORAL | Status: DC

## 2014-07-25 NOTE — Discharge Summary (Signed)
Obstetric Discharge Summary Reason for Admission: onset of labor Prenatal Procedures: ultrasound Intrapartum Procedures: spontaneous vaginal delivery Postpartum Procedures: Tdap Complications-Operative and Postpartum: none  Delivery Note At 8:37 PM a viable female was delivered via Vaginal, Spontaneous Delivery (Presentation: Right Occiput Anterior).  APGAR: 9, 9; weight 6 lb 5.4 oz (2875 g).   Placenta status: Intact, Spontaneous.  Cord: 2 vessels with the following complications: None.  Cord pH: N/a Anesthesia: Epidural  Episiotomy: None Lacerations: None Suture Repair: n/a Est. Blood Loss (mL): 300  Mom to postpartum.  Baby to Couplet care / Skin to Skin.  Cynthia Hood 07/25/2014, 8:02 AM     Hospital Course:  Active Problems:   Active labor   Today: No acute events overnight.  Pt denies problems with ambulating, voiding or po intake.  She denies nausea or vomiting.  Pain is moderately controlled.  She has had flatus. She has had bowel movement.  Lochia Moderate.  Plan for birth control is  IUD.  Method of Feeding: breast feeding.   Cynthia Hood is a 28 y.o. [phone] s/p SVD.  Patient presented to OBT contractions with early active labor and was admitted to L&D.  She has postpartum course that was uncomplicated including no problems with ambulating, PO intake, urination, pain, or bleeding. The pt feels ready to go home and  will be discharged with outpatient follow-up.    H/H: Lab Results  Component Value Date/Time   HGB 13.6 07/22/2014 10:41 PM   HCT 38.4 07/22/2014 10:41 PM    Discharge Diagnoses: Term Pregnancy-delivered  Discharge Information: Date: 07/25/2014 Activity: pelvic rest Diet: routine  Medications: Ibuprofen Breast feeding:  Yes Condition: stable Instructions: refer to handout Discharge to: home      Medication List         acyclovir 400 MG tablet  Commonly known as:  ZOVIRAX  Take 1 tablet (400 mg total) by mouth 3 (three) times  daily.     aspirin 81 MG tablet  Take 81 mg by mouth daily.     calcium carbonate 500 MG chewable tablet  Commonly known as:  TUMS - dosed in mg elemental calcium  Chew 2 tablets by mouth daily as needed for indigestion or heartburn.     ibuprofen 600 MG tablet  Commonly known as:  ADVIL,MOTRIN  Take 1 tablet (600 mg total) by mouth every 6 (six) hours.     prenatal multivitamin Tabs tablet  Take 1 tablet by mouth daily.           Follow-up Information   Call [facility]. (to make an appointment in 4-6 weeks after delivery)    Contact information:   [name] [id number] [phone]      Merla Riches ,MD OB Fellow 07/25/2014,8:02 AM

## 2014-07-25 NOTE — Discharge Instructions (Signed)

## 2014-07-25 NOTE — Lactation Note (Signed)
This note was copied from the chart of [name]. Lactation Consultation Note  Patient Name: Cynthia Hood WCHEN'I Date: 07/25/2014 Reason for consult: Follow-up assessment Baby 37 hours of life. Mom reports baby has been cluster-feeding and has a high palate and her nipples are sore. Slight redness on both nipples. Assisted mom to latch baby in football position onto left breast. Demonstrated to mom how to tug baby's chin and flange lower lip outward. Discussed basics of making sure baby has a deep latch. Mom reports increased comfort. Baby is deeply latch, suckling rhythmically with intermittent swallows noted. Mom able to hear swallows. Enc mom to continue to feed with cues, 8-12 times/24 hours. Referred parents to Baby and Me booklet for number of diapers to expect and EBM storage guidelines. Discussed engorgement prevention/treatment. Mom aware of OP/BFSG and [facility] phone help services. Mom has comfort gels. Enc to call [facility] department for assistance as needed.  Maternal Data    Feeding Feeding Type: Breast Fed Length of feed: 10 min  LATCH Score/Interventions Latch: Grasps breast easily, tongue down, lips flanged, rhythmical sucking.  Audible Swallowing: A few with stimulation  Type of Nipple: Everted at rest and after stimulation  Comfort (Breast/Nipple): Soft / non-tender     Hold (Positioning): No assistance needed to correctly position infant at breast. Intervention(s): Support Pillows  LATCH Score: 9  Lactation Tools Discussed/Used     Consult Status Consult Status: Complete    Lesli Albee, Veva Grimley 07/25/2014, 9:59 AM

## 2014-07-27 ENCOUNTER — Telehealth: Payer: Self-pay | Admitting: Obstetrics and Gynecology

## 2014-07-27 NOTE — Telephone Encounter (Signed)
Left message x 1. JSY 

## 2014-07-31 NOTE — Telephone Encounter (Signed)
Spoke with pt. She couldn't remember why she called. Pt states she is not having any concerns at this time. Encounter closed. [place]

## 2014-08-08 ENCOUNTER — Telehealth: Payer: Self-pay | Admitting: *Deleted

## 2014-08-08 NOTE — Telephone Encounter (Signed)
Mother Cynthia Hood will bring baby Cynthia Hood 08/09/14  in for a repeat PKU due to rejected one from hospital.

## 2014-08-14 ENCOUNTER — Telehealth: Payer: Self-pay | Admitting: *Deleted

## 2014-08-14 ENCOUNTER — Encounter: Payer: Self-pay | Admitting: Advanced Practice Midwife

## 2014-08-14 ENCOUNTER — Ambulatory Visit (INDEPENDENT_AMBULATORY_CARE_PROVIDER_SITE_OTHER): Payer: 59 | Admitting: Advanced Practice Midwife

## 2014-08-14 ENCOUNTER — Encounter: Payer: 59 | Admitting: Advanced Practice Midwife

## 2014-08-14 VITALS — BP 130/80 | Ht 62.0 in | Wt 155.0 lb

## 2014-08-14 DIAGNOSIS — O99345 Other mental disorders complicating the puerperium: Secondary | ICD-10-CM

## 2014-08-14 DIAGNOSIS — F419 Anxiety disorder, unspecified: Secondary | ICD-10-CM | POA: Insufficient documentation

## 2014-08-14 NOTE — Telephone Encounter (Signed)
Cynthia Hood, [facility] Department, states pt delivered vaginally on 07/23/2014 scored 13 on Edinburgh Depression Scale. Pt c/o anxiety, crying, and worrying. Pt was placed on the phone and states she was not having any thoughts of harming herself or others. Appt was made for pt to be seen today.

## 2014-08-14 NOTE — Progress Notes (Signed)
[facility] Visit  Patient name: JADEA SHIFFER MRN [id number]  Date of birth: 03/14/1986  CC & HPI:  SYNIYAH BOURNE is a 28 y.o. Caucasian female presenting today for anxiety 3 weeks following uncomplicated SVD.  She is also crying, emotional, and feels overwhelmed by anxiety.  Doesn't want meds, would like referral to talk therapy   Pertinent History Reviewed:  Medical & Surgical Hx:   Past Medical History  Diagnosis Date  . Ectopic pregnancy   . Colitis, ulcerative    Past Surgical History  Procedure Laterality Date  . No past surgeries     Medications: Reviewed & Updated - see associated section Social History: Reviewed -  reports that she has been smoking Cigarettes.  She has a 60 pack-year smoking history. She has never used smokeless tobacco.  Objective Findings:  Vitals: BP 130/80  Ht 5\' 2"  (1.575 m)  Wt 155 lb (70.308 kg)  BMI 28.34 kg/m2  Physical Examination: General appearance - alert, well appearing, and in no distress Mental status - alert, oriented to person, place, and time Denies SI/HI.  Feels happy, just very anxious and nervous  No results found for this or any previous visit (from the past 24 hour(s)).   Assessment & Plan:  A:   anxiety P:  Referral to [facility] ( has UHC/medicaid, so may refer to Behavior Health if pt wants to)  F/U 2 weeks for postpartum appt and 3 weeks for paraguard (no sex until paraguard)   CRESENZO-DISHMAN,Takya Vandivier CNM 08/14/2014 3:36 PM

## 2014-08-15 NOTE — Progress Notes (Signed)
  Encounter opened in error.  See progress notes from 9/22 for this visit

## 2014-08-28 ENCOUNTER — Encounter: Payer: Self-pay | Admitting: Obstetrics & Gynecology

## 2014-08-28 ENCOUNTER — Ambulatory Visit (INDEPENDENT_AMBULATORY_CARE_PROVIDER_SITE_OTHER): Payer: 59 | Admitting: Obstetrics & Gynecology

## 2014-08-28 VITALS — BP 110/80 | Wt 158.0 lb

## 2014-08-28 DIAGNOSIS — O99345 Other mental disorders complicating the puerperium: Secondary | ICD-10-CM

## 2014-08-28 DIAGNOSIS — IMO0001 Reserved for inherently not codable concepts without codable children: Secondary | ICD-10-CM

## 2014-08-28 DIAGNOSIS — F53 Postpartum depression: Secondary | ICD-10-CM

## 2014-08-28 MED ORDER — ESCITALOPRAM OXALATE 10 MG PO TABS: 10.0000 mg | ORAL_TABLET | Freq: Every day | ORAL | Status: DC

## 2014-08-28 NOTE — Progress Notes (Signed)
Patient ID: ADRIONA KANEY, female   DOB: 15-Jun-1986, 28 y.o.   MRN: [id number] Author: Shelbie Hutching, MD Service: Obstetrics Author Type: Physician   Filed: 07/23/2014 9:03 PM Note Time: 07/23/2014 9:02 PM Status: Signed   Editor: Shelbie Hutching, MD (Physician)      Delivery Note  At 8:37 PM a viable female was delivered via Vaginal, Spontaneous Delivery (Presentation: Right Occiput Anterior). APGAR: 9, 9; weight pending.  Placenta status: Intact, Spontaneous. Cord: 2 vessels with the following complications: None.  Anesthesia: Epidural  Episiotomy: None  Lacerations: None  Suture Repair: None  Est. Blood Loss (mL): 300  Mom to postpartum. Baby to Couplet care / Skin to Skin.  Shelbie Hutching  07/23/2014, 9:02 PM      Having difficulty with worry and anxiety since delivery No crying spells No harm thoughts  Blood pressure 110/80, weight 158 lb (71.668 kg), currently breastfeeding.  Abdomen  Soft non tender NEFG vagina pink moist no discharge Uterus normal involution Adnexa non tender  Normal post partum visit  post partum depression  Follow up for mole removal tomorrow

## 2014-08-29 ENCOUNTER — Encounter: Payer: Self-pay | Admitting: Obstetrics & Gynecology

## 2014-08-29 ENCOUNTER — Ambulatory Visit (INDEPENDENT_AMBULATORY_CARE_PROVIDER_SITE_OTHER): Payer: 59 | Admitting: Obstetrics & Gynecology

## 2014-08-29 VITALS — BP 110/70 | Wt 158.2 lb

## 2014-08-29 DIAGNOSIS — D229 Melanocytic nevi, unspecified: Secondary | ICD-10-CM

## 2014-08-29 NOTE — Addendum Note (Signed)
Addended by: Doyne Keel on: 08/29/2014 12:11 PM   Modules accepted: Orders

## 2014-08-29 NOTE — Progress Notes (Signed)
Patient ID: Cynthia Hood, female   DOB: Mar 23, 1986, 28 y.o.   MRN: [id number] Left inner thigh present since her pregnancy Irritating due to location with panty line   Left inner thigh prepped 1% lidocaine injected 5 cc total Scalpel used and removed without difficulty 3 3-0 ethilon sutures placed for hemostasis Dressed  Follow up for suture removal in 1 week

## 2014-09-04 ENCOUNTER — Ambulatory Visit (INDEPENDENT_AMBULATORY_CARE_PROVIDER_SITE_OTHER): Payer: 59 | Admitting: Women's Health

## 2014-09-04 ENCOUNTER — Encounter: Payer: Self-pay | Admitting: Women's Health

## 2014-09-04 VITALS — BP 108/62 | Ht 62.0 in | Wt 161.0 lb

## 2014-09-04 DIAGNOSIS — Z3202 Encounter for pregnancy test, result negative: Secondary | ICD-10-CM

## 2014-09-04 DIAGNOSIS — R109 Unspecified abdominal pain: Secondary | ICD-10-CM

## 2014-09-04 DIAGNOSIS — M545 Low back pain, unspecified: Secondary | ICD-10-CM

## 2014-09-04 DIAGNOSIS — Z30430 Encounter for insertion of intrauterine contraceptive device: Secondary | ICD-10-CM

## 2014-09-04 DIAGNOSIS — R10A1 Flank pain, right side: Secondary | ICD-10-CM

## 2014-09-04 DIAGNOSIS — Z1389 Encounter for screening for other disorder: Secondary | ICD-10-CM

## 2014-09-04 LAB — POCT URINALYSIS DIPSTICK
Blood, UA: NEGATIVE
Glucose, UA: NEGATIVE
Ketones, UA: NEGATIVE
Leukocytes, UA: NEGATIVE
Nitrite, UA: NEGATIVE
PROTEIN UA: NEGATIVE

## 2014-09-04 LAB — POCT URINE PREGNANCY: Preg Test, Ur: NEGATIVE

## 2014-09-04 NOTE — Patient Instructions (Signed)
Nothing in vagina for 3 days (no sex, douching, tampons, etc...)  Check your strings once a month to make sure you can feel them, if you are not able to please let us know  If you develop a fever of 100.4 or more in the next few weeks, or if you develop severe abdominal pain, please let [place] know  Use a backup method of birth control, such as condoms, for 2 weeks   Levonorgestrel intrauterine device (IUD)- Liletta What is this medicine? LEVONORGESTREL IUD (LEE voe nor jes trel) is a contraceptive (birth control) device. The device is placed inside the uterus by a healthcare professional. It is used to prevent pregnancy and can also be used to treat heavy bleeding that occurs during your period. Depending on the device, it can be used for 3 to 5 years. This medicine may be used for other purposes; ask your health care provider or pharmacist if you have questions. COMMON BRAND NAME(S): Verda Cumins What should I tell my health care provider before I take this medicine? They need to know if you have any of these conditions: -abnormal Pap smear -cancer of the breast, uterus, or cervix -diabetes -endometritis -genital or pelvic infection now or in the past -have more than one sexual partner or your partner has more than one partner -heart disease -history of an ectopic or tubal pregnancy -immune system problems -IUD in place -liver disease or tumor -problems with blood clots or take blood-thinners -use intravenous drugs -uterus of unusual shape -vaginal bleeding that has not been explained -an unusual or allergic reaction to levonorgestrel, other hormones, silicone, or polyethylene, medicines, foods, dyes, or preservatives -pregnant or trying to get pregnant -breast-feeding How should I use this medicine? This device is placed inside the uterus by a health care professional. Talk to your pediatrician regarding the use of this medicine in children. Special care may be  needed. Overdosage: If you think you have taken too much of this medicine contact a poison control center or emergency room at once. NOTE: This medicine is only for you. Do not share this medicine with others. What if I miss a dose? This does not apply. What may interact with this medicine? Do not take this medicine with any of the following medications: -amprenavir -bosentan -fosamprenavir This medicine may also interact with the following medications: -aprepitant -barbiturate medicines for inducing sleep or treating seizures -bexarotene -griseofulvin -medicines to treat seizures like carbamazepine, ethotoin, felbamate, oxcarbazepine, phenytoin, topiramate -modafinil -pioglitazone -rifabutin -rifampin -rifapentine -some medicines to treat HIV infection like atazanavir, indinavir, lopinavir, nelfinavir, tipranavir, ritonavir -St. John's wort -warfarin This list may not describe all possible interactions. Give your health care provider a list of all the medicines, herbs, non-prescription drugs, or dietary supplements you use. Also tell them if you smoke, drink alcohol, or use illegal drugs. Some items may interact with your medicine. What should I watch for while using this medicine? Visit your doctor or health care professional for regular check ups. See your doctor if you or your partner has sexual contact with others, becomes HIV positive, or gets a sexual transmitted disease. This product does not protect you against HIV infection (AIDS) or other sexually transmitted diseases. You can check the placement of the IUD yourself by reaching up to the top of your vagina with clean fingers to feel the threads. Do not pull on the threads. It is a good habit to check placement after each menstrual period. Call your doctor right away if you  feel more of the IUD than just the threads or if you cannot feel the threads at all. The IUD may come out by itself. You may become pregnant if the device  comes out. If you notice that the IUD has come out use a backup birth control method like condoms and call your health care provider. Using tampons will not change the position of the IUD and are okay to use during your period. What side effects may I notice from receiving this medicine? Side effects that you should report to your doctor or health care professional as soon as possible: -allergic reactions like skin rash, itching or hives, swelling of the face, lips, or tongue -fever, flu-like symptoms -genital sores -high blood pressure -no menstrual period for 6 weeks during use -pain, swelling, warmth in the leg -pelvic pain or tenderness -severe or sudden headache -signs of pregnancy -stomach cramping -sudden shortness of breath -trouble with balance, talking, or walking -unusual vaginal bleeding, discharge -yellowing of the eyes or skin Side effects that usually do not require medical attention (report to your doctor or health care professional if they continue or are bothersome): -acne -breast pain -change in sex drive or performance -changes in weight -cramping, dizziness, or faintness while the device is being inserted -headache -irregular menstrual bleeding within first 3 to 6 months of use -nausea This list may not describe all possible side effects. Call your doctor for medical advice about side effects. You may report side effects to FDA at 1-800-FDA-1088. Where should I keep my medicine? This does not apply. NOTE: This sheet is a summary. It may not cover all possible information. If you have questions about this medicine, talk to your doctor, pharmacist, or health care provider.  2015, Elsevier/Gold Standard. (2011-12-10 13:54:04)

## 2014-09-04 NOTE — Progress Notes (Signed)
Patient ID: Cynthia Hood, female   DOB: [birth year], 28 y.o.   MRN: [id number] Cynthia Hood is a 28 y.o. year old [id number] Caucasian female who presents for placement of a Liletta IUD. She also had a skin tag removed from Lt inner thigh last week here in office and has appt to have sutures removed on Thursday. Rt flank pain, feels like getting uti. No fever/chills, n/v.   No LMP recorded. BP 108/62  Ht 5\' 2"  (1.575 m)  Wt 161 lb (73.029 kg)  BMI 29.44 kg/m2  Breastfeeding? No Last sexual intercourse was 3wks ago, and pregnancy test today was neg  The risks and benefits of the method and placement have been thouroughly reviewed with the patient and all questions were answered.  Specifically the patient is aware of failure rate of 11/998, expulsion of the IUD and of possible perforation.  The patient is aware of irregular bleeding due to the method and understands the incidence of irregular bleeding diminishes with time.  Signed copy of informed consent in chart.  She understands the [place] is currently marketed for 3 years, but may be approved for 5 year and possibly 7 year use during the lifetime of her Liletta.  Time out was performed.  A graves speculum was placed in the vagina.  The cervix was visualized, prepped using Betadine, and grasped with a single tooth tenaculum. The uterus was found to be anteroflexed and it sounded to 7 cm.  Liletta IUD placed per manufacturer's recommendations.   The strings were trimmed to 3 cm.  Sonogram was performed and the proper placement of the IUD was verified via transvaginal u/s by myself & tasha, ultrasonographer   The patient was given post procedure instructions, including signs and symptoms of infection and to check for the strings after each menses or each month, and refraining from intercourse or anything in the vagina for 3 days.  She was given a [place] care card with date Liletta placed, and date Liletta to be removed.  Lt inner thigh,  healing well- co-exam w/ LHE, sutures removed w/o difficulty Back: No CVAT  She is scheduled for a f/u appointment in 4 weeks, cancel Thurs appt Will send urine culture. Urine dipstick today normal.   Tawnya Crook CNM, [facility] 09/04/2014 10:11 AM

## 2014-09-06 ENCOUNTER — Ambulatory Visit: Payer: 59 | Admitting: Obstetrics & Gynecology

## 2014-09-06 LAB — URINE CULTURE

## 2014-09-24 ENCOUNTER — Encounter: Payer: Self-pay | Admitting: Women's Health

## 2014-09-26 ENCOUNTER — Encounter: Payer: Self-pay | Admitting: Obstetrics & Gynecology

## 2014-10-02 ENCOUNTER — Ambulatory Visit: Payer: 59 | Admitting: Women's Health

## 2015-10-24 ENCOUNTER — Ambulatory Visit (INDEPENDENT_AMBULATORY_CARE_PROVIDER_SITE_OTHER): Payer: 59 | Admitting: Obstetrics & Gynecology

## 2015-10-24 ENCOUNTER — Encounter: Payer: Self-pay | Admitting: Obstetrics & Gynecology

## 2015-10-24 ENCOUNTER — Other Ambulatory Visit (HOSPITAL_COMMUNITY)
Admission: RE | Admit: 2015-10-24 | Discharge: 2015-10-24 | Disposition: A | Discharge status: Home / Self Care | Payer: 59 | Source: Ambulatory Visit | Attending: Obstetrics & Gynecology | Admitting: Obstetrics & Gynecology

## 2015-10-24 VITALS — BP 100/60 | HR 80 | Ht 62.0 in | Wt 152.3 lb

## 2015-10-24 DIAGNOSIS — Z1151 Encounter for screening for human papillomavirus (HPV): Secondary | ICD-10-CM | POA: Insufficient documentation

## 2015-10-24 DIAGNOSIS — Z30432 Encounter for removal of intrauterine contraceptive device: Secondary | ICD-10-CM

## 2015-10-24 DIAGNOSIS — Z01419 Encounter for gynecological examination (general) (routine) without abnormal findings: Secondary | ICD-10-CM | POA: Diagnosis not present

## 2015-10-24 DIAGNOSIS — Z01411 Encounter for gynecological examination (general) (routine) with abnormal findings: Secondary | ICD-10-CM | POA: Diagnosis present

## 2015-10-24 MED ORDER — VALACYCLOVIR HCL 1 G PO TABS: 500.0000 mg | ORAL_TABLET | Freq: Two times a day (BID) | ORAL | Status: DC

## 2015-10-24 MED ORDER — DESOGESTREL-ETHINYL ESTRADIOL 0.15-30 MG-MCG PO TABS: 1.0000 | ORAL_TABLET | Freq: Every day | ORAL | Status: DC

## 2015-10-24 NOTE — Progress Notes (Signed)
Patient ID: Cynthia Hood, female   DOB: 11/22/1986, 29 y.o.   MRN: [id number] Subjective:     Cynthia Hood is a 29 y.o. female here for a routine exam.  No LMP recorded. Patient is not currently having periods (Reason: IUD). [id number] Birth Control Method:  IUD Menstrual Calendar(currently): amenorrheic  Current complaints: wants IUD out due to severe cramping.   Current acute medical issues:  none   Recent Gynecologic History No LMP recorded. Patient is not currently having periods (Reason: IUD). Last Pap: 2015,  normal Last mammogram: ,    Past Medical History  Diagnosis Date  . Ectopic pregnancy   . Colitis, ulcerative ([place])     Past Surgical History  Procedure Laterality Date  . No past surgeries      OB History    Gravida Para Term Preterm AB TAB SAB Ectopic Multiple Living   5 1 1  4  4   1       Social History   Social History  . Marital Status: Single    Spouse Name: N/A  . Number of Children: N/A  . Years of Education: N/A   Social History Main Topics  . Smoking status: Light Tobacco Smoker -- 5.00 packs/day for 12 years    Types: Cigarettes  . Smokeless tobacco: Never Used  . Alcohol Use: No  . Drug Use: No  . Sexual Activity: Not Currently    Birth Control/ Protection: None   Other Topics Concern  . None   Social History Narrative    Family History  Problem Relation Age of Onset  . Diabetes Father   . Hypertension Father   . Heart disease Father   . Cancer Maternal Grandmother     colon    No current outpatient prescriptions on file.  Review of Systems  Review of Systems  Constitutional: Negative for fever, chills, weight loss, malaise/fatigue and diaphoresis.  HENT: Negative for hearing loss, ear pain, nosebleeds, congestion, sore throat, neck pain, tinnitus and ear discharge.   Eyes: Negative for blurred vision, double vision, photophobia, pain, discharge and redness.  Respiratory: Negative for cough, hemoptysis, sputum production,  shortness of breath, wheezing and stridor.   Cardiovascular: Negative for chest pain, palpitations, orthopnea, claudication, leg swelling and PND.  Gastrointestinal: negative for abdominal pain. Negative for heartburn, nausea, vomiting, diarrhea, constipation, blood in stool and melena.  Genitourinary: Negative for dysuria, urgency, frequency, hematuria and flank pain.  Musculoskeletal: Negative for myalgias, back pain, joint pain and falls.  Skin: Negative for itching and rash.  Neurological: Negative for dizziness, tingling, tremors, sensory change, speech change, focal weakness, seizures, loss of consciousness, weakness and headaches.  Endo/Heme/Allergies: Negative for environmental allergies and polydipsia. Does not bruise/bleed easily.  Psychiatric/Behavioral: Negative for depression, suicidal ideas, hallucinations, memory loss and substance abuse. The patient is not nervous/anxious and does not have insomnia.        Objective:  Blood pressure 100/60, pulse 80, height 5\' 2"  (1.575 m), weight 152 lb 4.8 oz (69.083 kg), not currently breastfeeding.   Physical Exam  Vitals reviewed. Constitutional: She is oriented to person, place, and time. She appears well-developed and well-nourished.  HENT:  Head: Normocephalic and atraumatic.        Right Ear: External ear normal.  Left Ear: External ear normal.  Nose: Nose normal.  Mouth/Throat: Oropharynx is clear and moist.  Eyes: Conjunctivae and EOM are normal. Pupils are equal, round, and reactive to light. Right eye exhibits no discharge. Left eye  exhibits no discharge. No scleral icterus.  Neck: Normal range of motion. Neck supple. No tracheal deviation present. No thyromegaly present.  Cardiovascular: Normal rate, regular rhythm, normal heart sounds and intact distal pulses.  Exam reveals no gallop and no friction rub.   No murmur heard. Respiratory: Effort normal and breath sounds normal. No respiratory distress. She has no wheezes. She  has no rales. She exhibits no tenderness.  GI: Soft. Bowel sounds are normal. She exhibits no distension and no mass. There is no tenderness. There is no rebound and no guarding.  Genitourinary:  Breasts no masses skin changes or nipple changes bilaterally      Vulva is normal without lesions Vagina is pink moist without discharge Cervix normal in appearance and pap is done, IUD removed Uterus is normal size shape and contour Adnexa is negative with normal sized ovaries   Musculoskeletal: Normal range of motion. She exhibits no edema and no tenderness.  Neurological: She is alert and oriented to person, place, and time. She has normal reflexes. She displays normal reflexes. No cranial nerve deficit. She exhibits normal muscle tone. Coordination normal.  Skin: Skin is warm and dry. No rash noted. No erythema. No pallor.  Psychiatric: She has a normal mood and affect. Her behavior is normal. Judgment and thought content normal.       Assessment:    Healthy female exam.    Plan:    Follow up in: 1 year. IUD removed    Begun on desogestrel 30 mics OCP

## 2015-10-28 LAB — CYTOLOGY - PAP

## 2015-12-20 ENCOUNTER — Telehealth: Payer: Self-pay | Admitting: Obstetrics & Gynecology

## 2015-12-20 MED ORDER — NORGESTIM-ETH ESTRAD TRIPHASIC 0.18/0.215/0.25 MG-35 MCG PO TABS: 1.0000 | ORAL_TABLET | Freq: Every day | ORAL | Status: DC

## 2015-12-20 NOTE — Telephone Encounter (Signed)
Pt states taking BCP after Mirena being removed, pt c/o acne. Pt requesting OCP to be switched to Ortho tri cyclen Lo to help with acne. Please advise.

## 2016-06-22 ENCOUNTER — Telehealth: Payer: Self-pay | Admitting: General Practice

## 2016-06-22 NOTE — Telephone Encounter (Signed)
Patient given an appointment for Wednesday with [facility] @ 2:15. Patient advised to arrive at 1:45 to fill out paper work.

## 2016-06-24 ENCOUNTER — Ambulatory Visit (INDEPENDENT_AMBULATORY_CARE_PROVIDER_SITE_OTHER): Payer: 59 | Admitting: Pediatrics

## 2016-06-24 ENCOUNTER — Encounter: Payer: Self-pay | Admitting: Pediatrics

## 2016-06-24 VITALS — BP 110/75 | HR 96 | Temp 98.2°F | Ht 62.0 in | Wt 147.2 lb

## 2016-06-24 DIAGNOSIS — F4323 Adjustment disorder with mixed anxiety and depressed mood: Secondary | ICD-10-CM | POA: Diagnosis not present

## 2016-06-24 MED ORDER — PAROXETINE HCL 10 MG PO TABS: 10.0000 mg | ORAL_TABLET | Freq: Every day | ORAL | 3 refills | Status: DC

## 2016-06-24 MED ORDER — TRAZODONE HCL 50 MG PO TABS: 25.0000 mg | ORAL_TABLET | Freq: Every evening | ORAL | 3 refills | Status: DC | PRN
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
## 2016-06-24 NOTE — Patient Instructions (Signed)
Take half a tab for 8 days, then take full tab  Let me know if no improvement in 4 weeks, we will increase the dose

## 2016-07-01 DIAGNOSIS — Z0289 Encounter for other administrative examinations: Secondary | ICD-10-CM

## 2016-07-06 ENCOUNTER — Telehealth: Payer: Self-pay | Admitting: Pediatrics

## 2016-07-07 NOTE — Telephone Encounter (Signed)
Yes, pt aware.

## 2016-11-30 ENCOUNTER — Other Ambulatory Visit: Payer: Self-pay | Admitting: Obstetrics & Gynecology

## 2016-12-03 ENCOUNTER — Other Ambulatory Visit: Payer: 59 | Admitting: Obstetrics & Gynecology

## 2016-12-14 ENCOUNTER — Ambulatory Visit (INDEPENDENT_AMBULATORY_CARE_PROVIDER_SITE_OTHER): Payer: 59 | Admitting: Obstetrics & Gynecology

## 2016-12-14 ENCOUNTER — Encounter: Payer: Self-pay | Admitting: Obstetrics & Gynecology

## 2016-12-14 ENCOUNTER — Other Ambulatory Visit (HOSPITAL_COMMUNITY)
Admission: RE | Admit: 2016-12-14 | Discharge: 2016-12-14 | Disposition: A | Discharge status: Home / Self Care | Payer: 59 | Source: Ambulatory Visit | Attending: Obstetrics & Gynecology | Admitting: Obstetrics & Gynecology

## 2016-12-14 VITALS — BP 90/50 | HR 80 | Ht 62.0 in | Wt 144.0 lb

## 2016-12-14 DIAGNOSIS — R87610 Atypical squamous cells of undetermined significance on cytologic smear of cervix (ASC-US): Secondary | ICD-10-CM | POA: Diagnosis not present

## 2016-12-14 DIAGNOSIS — Z01411 Encounter for gynecological examination (general) (routine) with abnormal findings: Secondary | ICD-10-CM | POA: Diagnosis not present

## 2016-12-14 DIAGNOSIS — Z01419 Encounter for gynecological examination (general) (routine) without abnormal findings: Secondary | ICD-10-CM

## 2016-12-14 MED ORDER — VALACYCLOVIR HCL 1 G PO TABS: 500.0000 mg | ORAL_TABLET | Freq: Two times a day (BID) | ORAL | 11 refills | Status: DC

## 2016-12-14 MED ORDER — NORGESTIM-ETH ESTRAD TRIPHASIC 0.18/0.215/0.25 MG-35 MCG PO TABS: 1.0000 | ORAL_TABLET | Freq: Every day | ORAL | 11 refills | Status: DC

## 2016-12-14 NOTE — Addendum Note (Signed)
Addended by: Diona Fanti A on: 12/14/2016 12:12 PM   Modules accepted: Orders

## 2016-12-14 NOTE — Progress Notes (Signed)
Subjective:     Cynthia Hood is a 31 y.o. female here for a routine exam.  Patient's last menstrual period was 11/29/2016. [id number] Birth Control Method:  OCP Menstrual Calendar(currently): regular  Current complaints: none.   Current acute medical issues:  none   Recent Gynecologic History Patient's last menstrual period was 11/29/2016. Last Pap: 2016,  normal Last mammogram: ,    Past Medical History:  Diagnosis Date  . Colitis, ulcerative ([place])   . Ectopic pregnancy     Past Surgical History:  Procedure Laterality Date  . NO PAST SURGERIES      OB History    Gravida Para Term Preterm AB Living   5 1 1   4 1    SAB TAB Ectopic Multiple Live Births   4       1      Social History   Social History  . Marital status: Single    Spouse name: N/A  . Number of children: N/A  . Years of education: N/A   Social History Main Topics  . Smoking status: Light Tobacco Smoker    Packs/day: 5.00    Years: 12.00    Types: Cigarettes  . Smokeless tobacco: Never Used  . Alcohol use No  . Drug use: No  . Sexual activity: Not Currently    Birth control/ protection: None   Other Topics Concern  . None   Social History Narrative  . None    Family History  Problem Relation Age of Onset  . Diabetes Father   . Hypertension Father   . Heart disease Father   . Cancer Maternal Grandmother     colon     Current Outpatient Prescriptions:  .  Norgestimate-Ethinyl Estradiol Triphasic (TRI-SPRINTEC) 0.18/0.215/0.25 MG-35 MCG tablet, Take 1 tablet by mouth daily., Disp: 28 tablet, Rfl: 11 .  valACYclovir (VALTREX) 1000 MG tablet, Take 0.5 tablets (500 mg total) by mouth 2 (two) times daily., Disp: 10 tablet, Rfl: 11  Review of Systems  Review of Systems  Constitutional: Negative for fever, chills, weight loss, malaise/fatigue and diaphoresis.  HENT: Negative for hearing loss, ear pain, nosebleeds, congestion, sore throat, neck pain, tinnitus and ear discharge.   Eyes:  Negative for blurred vision, double vision, photophobia, pain, discharge and redness.  Respiratory: Negative for cough, hemoptysis, sputum production, shortness of breath, wheezing and stridor.   Cardiovascular: Negative for chest pain, palpitations, orthopnea, claudication, leg swelling and PND.  Gastrointestinal: negative for abdominal pain. Negative for heartburn, nausea, vomiting, diarrhea, constipation, blood in stool and melena.  Genitourinary: Negative for dysuria, urgency, frequency, hematuria and flank pain.  Musculoskeletal: Negative for myalgias, back pain, joint pain and falls.  Skin: Negative for itching and rash.  Neurological: Negative for dizziness, tingling, tremors, sensory change, speech change, focal weakness, seizures, loss of consciousness, weakness and headaches.  Endo/Heme/Allergies: Negative for environmental allergies and polydipsia. Does not bruise/bleed easily.  Psychiatric/Behavioral: Negative for depression, suicidal ideas, hallucinations, memory loss and substance abuse. The patient is not nervous/anxious and does not have insomnia.        Objective:  Blood pressure (!) 90/50, pulse 80, height 5\' 2"  (1.575 m), weight 144 lb (65.3 kg), last menstrual period 11/29/2016.   Physical Exam  Vitals reviewed. Constitutional: She is oriented to person, place, and time. She appears well-developed and well-nourished.  HENT:  Head: Normocephalic and atraumatic.        Right Ear: External ear normal.  Left Ear: External ear normal.  Nose: Nose  normal.  Mouth/Throat: Oropharynx is clear and moist.  Eyes: Conjunctivae and EOM are normal. Pupils are equal, round, and reactive to light. Right eye exhibits no discharge. Left eye exhibits no discharge. No scleral icterus.  Neck: Normal range of motion. Neck supple. No tracheal deviation present. No thyromegaly present.  Cardiovascular: Normal rate, regular rhythm, normal heart sounds and intact distal pulses.  Exam reveals no  gallop and no friction rub.   No murmur heard. Respiratory: Effort normal and breath sounds normal. No respiratory distress. She has no wheezes. She has no rales. She exhibits no tenderness.  GI: Soft. Bowel sounds are normal. She exhibits no distension and no mass. There is no tenderness. There is no rebound and no guarding.  Genitourinary:  Breasts no masses skin changes or nipple changes bilaterally      Vulva is normal without lesions Vagina is pink moist without discharge Cervix normal in appearance and pap is done Uterus is normal size shape and contour Adnexa is negative with normal sized ovaries   Musculoskeletal: Normal range of motion. She exhibits no edema and no tenderness.  Neurological: She is alert and oriented to person, place, and time. She has normal reflexes. She displays normal reflexes. No cranial nerve deficit. She exhibits normal muscle tone. Coordination normal.  Skin: Skin is warm and dry. No rash noted. No erythema. No pallor.  Psychiatric: She has a normal mood and affect. Her behavior is normal. Judgment and thought content normal.       Medications Ordered at today's visit: Meds ordered this encounter  Medications  . Norgestimate-Ethinyl Estradiol Triphasic (TRI-SPRINTEC) 0.18/0.215/0.25 MG-35 MCG tablet    Sig: Take 1 tablet by mouth daily.    Dispense:  28 tablet    Refill:  11    Please consider 90 day supplies to promote better adherence  . valACYclovir (VALTREX) 1000 MG tablet    Sig: Take 0.5 tablets (500 mg total) by mouth 2 (two) times daily.    Dispense:  10 tablet    Refill:  11    Other orders placed at today's visit: No orders of the defined types were placed in this encounter.     Assessment:    Healthy female exam.    Plan:    Contraception: OCP (estrogen/progesterone). Follow up in: 1 year.     Return in about 1 year (around 12/14/2017) for yearly, with Dr Elonda Husky.

## 2016-12-16 ENCOUNTER — Telehealth: Payer: Self-pay | Admitting: Obstetrics & Gynecology

## 2016-12-16 LAB — CYTOLOGY - PAP

## 2016-12-16 NOTE — Telephone Encounter (Signed)
Pt called stating that her and Dr. Elonda Husky was discussing a medication and would like for Dr. Elonda Husky to prescribe her some Chantax. Please contact pt

## 2016-12-22 NOTE — Telephone Encounter (Signed)
Patient was seen on 12/14/16 and states you suggested her trying Chantix. Pt would like prescription.

## 2017-12-01 ENCOUNTER — Encounter: Payer: Self-pay | Admitting: Pediatrics

## 2017-12-01 ENCOUNTER — Ambulatory Visit: Payer: 59 | Admitting: Pediatrics

## 2017-12-01 VITALS — BP 110/68 | HR 94 | Temp 98.0°F | Ht 62.0 in | Wt 155.0 lb

## 2017-12-01 DIAGNOSIS — G4700 Insomnia, unspecified: Secondary | ICD-10-CM | POA: Diagnosis not present

## 2017-12-01 DIAGNOSIS — Z720 Tobacco use: Secondary | ICD-10-CM

## 2017-12-01 DIAGNOSIS — F419 Anxiety disorder, unspecified: Secondary | ICD-10-CM | POA: Diagnosis not present

## 2017-12-01 MED ORDER — VARENICLINE TARTRATE 0.5 MG X 11 & 1 MG X 42 PO MISC: ORAL | 0 refills | Status: DC

## 2017-12-01 MED ORDER — VARENICLINE TARTRATE 1 MG PO TABS: 1.0000 mg | ORAL_TABLET | Freq: Two times a day (BID) | ORAL | 2 refills | Status: DC

## 2017-12-01 NOTE — Progress Notes (Signed)
  Subjective:   Patient ID: Cynthia Hood, female    DOB: 20-Jun-1986, 32 y.o.   MRN: [id number] CC: Nicotine Dependence  HPI: Cynthia Hood is a 32 y.o. female presenting for Nicotine Dependence  Wants to quit smoking Cut back to 1 cig a day at the end of pregnancy 3-4 years ago Now smoking about 1 pack a day, sometimes more Has done some research into cessation medicines Has some ongoing anxiety when she is around groups of people Has tried paxil in the past, did not like how it made her feel Does not want to start a medicine for anxiety right now, is open to doing in the future needed  Sometimes has a hard time falling asleep because of not being able to turn off thinking   Relevant past medical, surgical, family and social history reviewed. Allergies and medications reviewed and updated.  ROS: Per HPI   Objective:    BP 110/68   Pulse 94   Temp 98 F (36.7 C) (Oral)   Ht 5\' 2"  (1.575 m)   Wt 155 lb (70.3 kg)   BMI 28.35 kg/m   Wt Readings from Last 3 Encounters:  12/01/17 155 lb (70.3 kg)  12/14/16 144 lb (65.3 kg)  06/24/16 147 lb 3.2 oz (66.8 kg)    Gen: NAD, alert, cooperative with exam, NCAT EYES: EOMI, no conjunctival injection, or no icterus ENT:  TMs pearly gray b/l, OP without erythema LYMPH: no cervical LAD CV: NRRR, normal S1/S2, no murmur, distal pulses 2+ b/l Resp: CTABL, no wheezes, normal WOB Ext: No edema, warm Neuro: Alert and oriented, strength equal b/l UE and LE, coordination grossly normal MSK: normal muscle bulk Psych: mood is "good", normal affect  Assessment & Plan:  Reve was seen today for nicotine dependence.  Diagnoses and all orders for this visit:  Tobacco use Discussed strategies and medication options Will start Chantix, Rx for starting month and continuing month given Discussed decreasing cigarettes over the next couple months until quit date Start Chantix 2 weeks before quit date Return to clinic 2-3 months after starting  Chantix, sooner if needed -     varenicline (CHANTIX PAK) 0.5 MG X 11 & 1 MG X 42 tablet; Take one 0.5 mg tablet by mouth once daily for 3 days, then increase to one 0.5 mg tablet twice daily for 4 days, then increase to one 1 mg tablet twice daily. -     varenicline (CHANTIX) 1 MG tablet; Take 1 tablet (1 mg total) by mouth 2 (two) times daily.  Anxiety Discussed options, patient wants to wait to start any kind of medicine  Insomnia, unspecified type Discussed sleep hygiene, okay to try melatonin  Follow up plan: Approximately 3 months, sooner if needed Assunta Found, MD [place]

## 2017-12-01 NOTE — Patient Instructions (Addendum)
Steps to Quit Smoking Smoking tobacco can be harmful to your health and can affect almost every organ in your body. Smoking puts you, and those around you, at risk for developing many serious chronic diseases. Quitting smoking is difficult, but it is one of the best things that you can do for your health. It is never too late to quit. What are the benefits of quitting smoking? When you quit smoking, you lower your risk of developing serious diseases and conditions, such as:  Lung cancer or lung disease, such as COPD.  Heart disease.  Stroke.  Heart attack.  Infertility.  Osteoporosis and bone fractures.  Additionally, symptoms such as coughing, wheezing, and shortness of breath may get better when you quit. You may also find that you get sick less often because your body is stronger at fighting off colds and infections. If you are pregnant, quitting smoking can help to reduce your chances of having a baby of low birth weight. How do I get ready to quit? When you decide to quit smoking, create a plan to make sure that you are successful. Before you quit:  Pick a date to quit. Set a date within the next two weeks to give you time to prepare.  Write down the reasons why you are quitting. Keep this list in places where you will see it often, such as on your bathroom mirror or in your car or wallet.  Identify the people, places, things, and activities that make you want to smoke (triggers) and avoid them. Make sure to take these actions: ? Throw away all cigarettes at home, at work, and in your car. ? Throw away smoking accessories, such as ashtrays and lighters. ? Clean your car and make sure to empty the ashtray. ? Clean your home, including curtains and carpets.  Tell your family, friends, and coworkers that you are quitting. Support from your loved ones can make quitting easier.  Talk with your health care provider about your options for quitting smoking.  Find out what treatment  options are covered by your health insurance.  What strategies can I use to quit smoking? Talk with your healthcare provider about different strategies to quit smoking. Some strategies include:  Quitting smoking altogether instead of gradually lessening how much you smoke over a period of time. Research shows that quitting "cold turkey" is more successful than gradually quitting.  Attending in-person counseling to help you build problem-solving skills. You are more likely to have success in quitting if you attend several counseling sessions. Even short sessions of 10 minutes can be effective.  Finding resources and support systems that can help you to quit smoking and remain smoke-free after you quit. These resources are most helpful when you use them often. They can include: ? Online chats with a counselor. ? Telephone quitlines. ? Printed self-help materials. ? Support groups or group counseling. ? Text messaging programs. ? Mobile phone applications.  Taking medicines to help you quit smoking. (If you are pregnant or breastfeeding, talk with your health care provider first.) Some medicines contain nicotine and some do not. Both types of medicines help with cravings, but the medicines that include nicotine help to relieve withdrawal symptoms. Your health care provider may recommend: ? Nicotine patches, gum, or lozenges. ? Nicotine inhalers or sprays. ? Non-nicotine medicine that is taken by mouth.  Talk with your health care provider about combining strategies, such as taking medicines while you are also receiving in-person counseling. Using these two strategies together   makes you more likely to succeed in quitting than if you used either strategy on its own. If you are pregnant or breastfeeding, talk with your health care provider about finding counseling or other support strategies to quit smoking. Do not take medicine to help you quit smoking unless told to do so by your health care  provider. What things can I do to make it easier to quit? Quitting smoking might feel overwhelming at first, but there is a lot that you can do to make it easier. Take these important actions:  Reach out to your family and friends and ask that they support and encourage you during this time. Call telephone quitlines, reach out to support groups, or work with a counselor for support.  Ask people who smoke to avoid smoking around you.  Avoid places that trigger you to smoke, such as bars, parties, or smoke-break areas at work.  Spend time around people who do not smoke.  Lessen stress in your life, because stress can be a smoking trigger for some people. To lessen stress, try: ? Exercising regularly. ? Deep-breathing exercises. ? Yoga. ? Meditating. ? Performing a body scan. This involves closing your eyes, scanning your body from head to toe, and noticing which parts of your body are particularly tense. Purposefully relax the muscles in those areas.  Download or purchase mobile phone or tablet apps (applications) that can help you stick to your quit plan by providing reminders, tips, and encouragement. There are many free apps, such as QuitGuide from the CDC (Centers for Disease Control and Prevention). You can find other support for quitting smoking (smoking cessation) through smokefree.gov and other websites.  How will I feel when I quit smoking? Within the first 24 hours of quitting smoking, you may start to feel some withdrawal symptoms. These symptoms are usually most noticeable 2-3 days after quitting, but they usually do not last beyond 2-3 weeks. Changes or symptoms that you might experience include:  Mood swings.  Restlessness, anxiety, or irritation.  Difficulty concentrating.  Dizziness.  Strong cravings for sugary foods in addition to nicotine.  Mild weight gain.  Constipation.  Nausea.  Coughing or a sore throat.  Changes in how your medicines work in your  body.  A depressed mood.  Difficulty sleeping (insomnia).  After the first 2-3 weeks of quitting, you may start to notice more positive results, such as:  Improved sense of smell and taste.  Decreased coughing and sore throat.  Slower heart rate.  Lower blood pressure.  Clearer skin.  The ability to breathe more easily.  Fewer sick days.  Quitting smoking is very challenging for most people. Do not get discouraged if you are not successful the first time. Some people need to make many attempts to quit before they achieve long-term success. Do your best to stick to your quit plan, and talk with your health care provider if you have any questions or concerns. This information is not intended to replace advice given to you by your health care provider. Make sure you discuss any questions you have with your health care provider. Document Released: 11/03/2001 Document Revised: 07/07/2016 Document Reviewed: 03/26/2015 Elsevier Interactive Patient Education  2018 Elsevier Inc.  

## 2017-12-02 ENCOUNTER — Other Ambulatory Visit: Payer: Self-pay | Admitting: Obstetrics & Gynecology

## 2017-12-04 ENCOUNTER — Encounter: Payer: Self-pay | Admitting: Pediatrics

## 2017-12-09 ENCOUNTER — Telehealth: Payer: Self-pay

## 2017-12-09 NOTE — Telephone Encounter (Signed)
Insurance denied prior authorization for Chantix  Have to have had failure or intolerance to one of the following Nicotine replacement patches TOC, Nicotine gum OTC Nicotine lozenge OTC  Also needs to have a hx of failure to Generic Zyban

## 2017-12-10 ENCOUNTER — Other Ambulatory Visit: Payer: Self-pay | Admitting: *Deleted

## 2017-12-10 MED ORDER — BUPROPION HCL ER (SR) 150 MG PO TB12: ORAL_TABLET | ORAL | 2 refills | Status: DC

## 2017-12-10 NOTE — Telephone Encounter (Signed)
If OK with pt can send in wellbutrin 150mg  BID. Take one tab for 3 days then take twice a day, #60 tabs with 2 refills.

## 2017-12-10 NOTE — Telephone Encounter (Signed)
Aware. She wants script sent to [facility].

## 2017-12-30 ENCOUNTER — Telehealth: Payer: Self-pay | Admitting: Obstetrics & Gynecology

## 2017-12-30 ENCOUNTER — Encounter: Payer: Self-pay | Admitting: Pediatrics

## 2017-12-30 ENCOUNTER — Ambulatory Visit: Payer: 59 | Admitting: Pediatrics

## 2017-12-30 ENCOUNTER — Other Ambulatory Visit: Payer: Self-pay | Admitting: Obstetrics & Gynecology

## 2017-12-30 VITALS — BP 112/77 | HR 76 | Temp 97.8°F | Ht 62.0 in | Wt 156.4 lb

## 2017-12-30 DIAGNOSIS — R51 Headache: Secondary | ICD-10-CM

## 2017-12-30 DIAGNOSIS — H5213 Myopia, bilateral: Secondary | ICD-10-CM | POA: Diagnosis not present

## 2017-12-30 DIAGNOSIS — R519 Headache, unspecified: Secondary | ICD-10-CM

## 2017-12-30 NOTE — Progress Notes (Signed)
  Subjective:   Patient ID: Cynthia Hood, female    DOB: 1986-08-11, 32 y.o.   MRN: [id number] CC: Dizziness; Headache; and Nausea  HPI: Cynthia Hood is a 32 y.o. female presenting for Dizziness; Headache; and Nausea  Tobacco use: insurance didn't cover chantix, started on wellbutrin instead. Started about 2-3 weeks ago. Took once daily for 4 days, the next day started feeling worse throughout night shift, more nauseous, felt lightheaded like about to pass out, having some sharp pains in her head off and on. Feeling normal self otherwise.  Has taken goody powders, improves for about an hour, sometimes pain improves, lightheadedness doesn't go away.  Sleeping fine, head pressure doesn't wake her up but she has it when she wakes up. Feels like a pressure constantly, has fogginess/feels slightly disoriented all the time, sharp pains from neck to to forehead. Had a severe headache yesterday started at 3pm, lasted until 9pm, improved an hour after she took three tylenol.   Not any worse when lying down or coughing  Has been taking sprintec regularly.  LMP: 4 weeks ago, due to start today. Has had two negative pregnancy tests Had four miscarriages before OB put her on blood thinner then was able to carry baby to term  Relevant past medical, surgical, family and social history reviewed. Allergies and medications reviewed and updated. Social History   Tobacco Use  Smoking Status Smoker, Current Status Unknown  . Packs/day: 0.50  . Years: 12.00  . Pack years: 6.00  . Types: Cigarettes  Smokeless Tobacco Never Used   ROS: Per HPI   Objective:    BP 112/77   Pulse 76   Temp 97.8 F (36.6 C) (Oral)   Ht 5\' 2"  (1.575 m)   Wt 156 lb 6.4 oz (70.9 kg)   BMI 28.61 kg/m   Wt Readings from Last 3 Encounters:  12/30/17 156 lb 6.4 oz (70.9 kg)  12/01/17 155 lb (70.3 kg)  12/14/16 144 lb (65.3 kg)    Gen: NAD, alert, cooperative with exam, NCAT EYES: PERRL, EOMI, no conjunctival  injection, or no icterus ENT:  TMs pearly gray b/l, OP without erythema LYMPH: no cervical LAD CV: NRRR, normal S1/S2, no murmur, distal pulses 2+ b/l Resp: CTABL, no wheezes, normal WOB Abd: +BS, soft, NTND. no guarding or organomegaly Ext: No edema, warm Neuro: Alert and oriented, strength equal b/l UE and LE, coordination grossly normal, sensation intact on ext, CN III-XII intact MSK: normal muscle bulk  Assessment & Plan:  Cynthia Hood was seen today for dizziness, headache and nausea.  Diagnoses and all orders for this visit:  Nonintractable headache, unspecified chronicity pattern, unspecified headache type New daily headache, feeling nauseous. No focal neuro deficit. Rec imaging, MRI/MRV, pt wants to wait. She will get eyes examed to eval papilledema today. OK to take try ibuprofen for HA.  Follow up plan: 2 week, sooner if needed Assunta Found, MD [place]

## 2017-12-31 ENCOUNTER — Other Ambulatory Visit: Payer: Self-pay | Admitting: Obstetrics & Gynecology

## 2017-12-31 MED ORDER — NORGESTIM-ETH ESTRAD TRIPHASIC 0.18/0.215/0.25 MG-35 MCG PO TABS: 1.0000 | ORAL_TABLET | Freq: Every day | ORAL | 1 refills | Status: DC

## 2018-01-02 ENCOUNTER — Encounter: Payer: Self-pay | Admitting: Pediatrics

## 2018-01-18 ENCOUNTER — Other Ambulatory Visit (HOSPITAL_COMMUNITY)
Admission: RE | Admit: 2018-01-18 | Discharge: 2018-01-18 | Disposition: A | Discharge status: Home / Self Care | Payer: 59 | Source: Ambulatory Visit | Attending: Obstetrics & Gynecology | Admitting: Obstetrics & Gynecology

## 2018-01-18 ENCOUNTER — Ambulatory Visit (INDEPENDENT_AMBULATORY_CARE_PROVIDER_SITE_OTHER): Payer: 59 | Admitting: Obstetrics & Gynecology

## 2018-01-18 ENCOUNTER — Encounter: Payer: Self-pay | Admitting: Obstetrics & Gynecology

## 2018-01-18 VITALS — BP 100/78 | HR 88 | Ht 62.0 in | Wt 154.0 lb

## 2018-01-18 DIAGNOSIS — R87612 Low grade squamous intraepithelial lesion on cytologic smear of cervix (LGSIL): Secondary | ICD-10-CM | POA: Diagnosis not present

## 2018-01-18 DIAGNOSIS — Z01419 Encounter for gynecological examination (general) (routine) without abnormal findings: Secondary | ICD-10-CM | POA: Insufficient documentation

## 2018-01-18 DIAGNOSIS — Z01411 Encounter for gynecological examination (general) (routine) with abnormal findings: Secondary | ICD-10-CM | POA: Diagnosis not present

## 2018-01-18 DIAGNOSIS — Z8742 Personal history of other diseases of the female genital tract: Secondary | ICD-10-CM | POA: Diagnosis not present

## 2018-01-18 DIAGNOSIS — F419 Anxiety disorder, unspecified: Secondary | ICD-10-CM | POA: Diagnosis not present

## 2018-01-18 MED ORDER — NORGESTIM-ETH ESTRAD TRIPHASIC 0.18/0.215/0.25 MG-35 MCG PO TABS: 1.0000 | ORAL_TABLET | Freq: Every day | ORAL | 3 refills | Status: DC

## 2018-01-18 NOTE — Progress Notes (Signed)
Subjective:     Cynthia Hood is a 32 y.o. female here for a routine exam.  Patient's last menstrual period was 12/30/2017 (approximate). [id number] Birth Control Method:  OCP Menstrual Calendar(currently): regular  Current complaints: anxiety on wellbutin, trying to stop smoking.   Current acute medical issues:  anxiety   Recent Gynecologic History Patient's last menstrual period was 12/30/2017 (approximate). Last Pap: 2018,  LSIL Last mammogram: n/a,    Past Medical History:  Diagnosis Date  . Anxiety   . Colitis, ulcerative ([place])   . Ectopic pregnancy     Past Surgical History:  Procedure Laterality Date  . NO PAST SURGERIES      OB History    Gravida Para Term Preterm AB Living   5 1 1   4 1    SAB TAB Ectopic Multiple Live Births   4       1      Social History   Socioeconomic History  . Marital status: Single    Spouse name: None  . Number of children: None  . Years of education: None  . Highest education level: None  Social Needs  . Financial resource strain: None  . Food insecurity - worry: None  . Food insecurity - inability: None  . Transportation needs - medical: None  . Transportation needs - non-medical: None  Occupational History  . None  Tobacco Use  . Smoking status: Current Some Day Smoker    Packs/day: 0.50    Years: 12.00    Pack years: 6.00    Types: Cigarettes  . Smokeless tobacco: Never Used  Substance and Sexual Activity  . Alcohol use: Yes    Comment: occ  . Drug use: No  . Sexual activity: Not Currently    Birth control/protection: None, Condom  Other Topics Concern  . None  Social History Narrative  . None    Family History  Problem Relation Age of Onset  . Diabetes Father   . Hypertension Father   . Heart disease Father   . Cancer Maternal Grandmother        colon     Current Outpatient Medications:  .  buPROPion (WELLBUTRIN SR) 150 MG 12 hr tablet, Take one tab for 3 days then take twice a day, Disp: 60 tablet,  Rfl: 2 .  Norgestimate-Ethinyl Estradiol Triphasic (TRI-SPRINTEC) 0.18/0.215/0.25 MG-35 MCG tablet, Take 1 tablet by mouth daily., Disp: 84 tablet, Rfl: 3 .  valACYclovir (VALTREX) 1000 MG tablet, Take 0.5 tablets (500 mg total) by mouth 2 (two) times daily. (Patient not taking: Reported on 12/30/2017), Disp: 10 tablet, Rfl: 11  Review of Systems  Review of Systems  Constitutional: Negative for fever, chills, weight loss, malaise/fatigue and diaphoresis.  HENT: Negative for hearing loss, ear pain, nosebleeds, congestion, sore throat, neck pain, tinnitus and ear discharge.   Eyes: Negative for blurred vision, double vision, photophobia, pain, discharge and redness.  Respiratory: Negative for cough, hemoptysis, sputum production, shortness of breath, wheezing and stridor.   Cardiovascular: Negative for chest pain, palpitations, orthopnea, claudication, leg swelling and PND.  Gastrointestinal: negative for abdominal pain. Negative for heartburn, nausea, vomiting, diarrhea, constipation, blood in stool and melena.  Genitourinary: Negative for dysuria, urgency, frequency, hematuria and flank pain.  Musculoskeletal: Negative for myalgias, back pain, joint pain and falls.  Skin: Negative for itching and rash.  Neurological: Negative for dizziness, tingling, tremors, sensory change, speech change, focal weakness, seizures, loss of consciousness, weakness and headaches.  Endo/Heme/Allergies: Negative for environmental  allergies and polydipsia. Does not bruise/bleed easily.  Psychiatric/Behavioral: Negative for depression, suicidal ideas, hallucinations, memory loss and substance abuse. The patient is not nervous/anxious and does not have insomnia.        Objective:  Blood pressure 100/78, pulse 88, height 5\' 2"  (1.575 m), weight 154 lb (69.9 kg), last menstrual period 12/30/2017.   Physical Exam  Vitals reviewed. Constitutional: She is oriented to person, place, and time. She appears well-developed  and well-nourished.  HENT:  Head: Normocephalic and atraumatic.        Right Ear: External ear normal.  Left Ear: External ear normal.  Nose: Nose normal.  Mouth/Throat: Oropharynx is clear and moist.  Eyes: Conjunctivae and EOM are normal. Pupils are equal, round, and reactive to light. Right eye exhibits no discharge. Left eye exhibits no discharge. No scleral icterus.  Neck: Normal range of motion. Neck supple. No tracheal deviation present. No thyromegaly present.  Cardiovascular: Normal rate, regular rhythm, normal heart sounds and intact distal pulses.  Exam reveals no gallop and no friction rub.   No murmur heard. Respiratory: Effort normal and breath sounds normal. No respiratory distress. She has no wheezes. She has no rales. She exhibits no tenderness.  GI: Soft. Bowel sounds are normal. She exhibits no distension and no mass. There is no tenderness. There is no rebound and no guarding.  Genitourinary:  Breasts no masses skin changes or nipple changes bilaterally      Vulva is normal without lesions Vagina is pink moist without discharge Cervix normal in appearance and pap is done Uterus is normal size shape and contour Adnexa is negative with normal sized ovaries   Musculoskeletal: Normal range of motion. She exhibits no edema and no tenderness.  Neurological: She is alert and oriented to person, place, and time. She has normal reflexes. She displays normal reflexes. No cranial nerve deficit. She exhibits normal muscle tone. Coordination normal.  Skin: Skin is warm and dry. No rash noted. No erythema. No pallor.  Psychiatric: She has a normal mood and affect. Her behavior is normal. Judgment and thought content normal.       Medications Ordered at today's visit: Meds ordered this encounter  Medications  . Norgestimate-Ethinyl Estradiol Triphasic (TRI-SPRINTEC) 0.18/0.215/0.25 MG-35 MCG tablet    Sig: Take 1 tablet by mouth daily.    Dispense:  84 tablet    Refill:  3     Please consider 90 day supplies to promote better adherence    Other orders placed at today's visit: No orders of the defined types were placed in this encounter.     Assessment:    Healthy female exam.   Hx of LSIL on Pap with normal colposcopy Plan:    Contraception: OCP (estrogen/progesterone). Follow up in: 1 months. or depending on Pap report indication     No Follow-up on file.

## 2018-01-20 LAB — CYTOLOGY - PAP: Diagnosis: HIGH — AB

## 2018-01-20 NOTE — Telephone Encounter (Signed)
Unable to leave VM

## 2018-01-24 NOTE — Telephone Encounter (Signed)
Unable to leave VM. Not setup

## 2018-01-26 ENCOUNTER — Telehealth: Payer: Self-pay | Admitting: *Deleted

## 2018-01-26 NOTE — Telephone Encounter (Signed)
3rd attempt to contact patient regarding abnormal pap. Will send certified letter.

## 2018-03-01 ENCOUNTER — Ambulatory Visit: Payer: 59 | Admitting: Obstetrics & Gynecology

## 2018-03-01 ENCOUNTER — Encounter: Payer: Self-pay | Admitting: Obstetrics & Gynecology

## 2018-03-01 ENCOUNTER — Other Ambulatory Visit: Payer: Self-pay | Admitting: Obstetrics & Gynecology

## 2018-03-01 VITALS — BP 90/60 | HR 97 | Wt 153.0 lb

## 2018-03-01 DIAGNOSIS — N871 Moderate cervical dysplasia: Secondary | ICD-10-CM | POA: Diagnosis not present

## 2018-03-01 DIAGNOSIS — Z3202 Encounter for pregnancy test, result negative: Secondary | ICD-10-CM | POA: Diagnosis not present

## 2018-03-01 DIAGNOSIS — R87613 High grade squamous intraepithelial lesion on cytologic smear of cervix (HGSIL): Secondary | ICD-10-CM | POA: Diagnosis not present

## 2018-03-01 LAB — POCT URINE PREGNANCY: PREG TEST UR: NEGATIVE

## 2018-03-01 NOTE — Progress Notes (Signed)
Colposcopy Procedure Note:  Colposcopy Procedure Note  Indications: Pap smear 1 months ago showed: high-grade squamous intraepithelial neoplasia  (HGSIL-encompassing moderate and severe dysplasia). The prior pap showed low grade cervical dysplasia.  Prior cervical/vaginal disease: LSIL. Prior cervical treatment: no treatment.  Smoker:  Yes.   New sexual partner:  No.  : time frame:    History of abnormal Pap: yes  Procedure Details  The risks and benefits of the procedure and Written informed consent obtained.  Speculum placed in vagina and excellent visualization of cervix achieved, cervix swabbed x 3 with acetic acid solution.  Findings:adequate colposcopy Cervix: ;dense acetowhite changes with punctation and mosaicism bopsy taken at 1 o'clcok. Vaginal inspection: normal without visible lesions. Vulvar colposcopy: vulvar colposcopy not performed.  Specimens: cervical biopsy  Complications: none.  Plan: Specimens labelled and sent to Pathology. Follow up 1 week  Will need laser ablation of the cervix for therapy

## 2018-03-01 NOTE — Addendum Note (Signed)
Addended by: Diona Fanti A on: 03/01/2018 02:35 PM   Modules accepted: Orders

## 2018-03-10 ENCOUNTER — Ambulatory Visit: Payer: 59 | Admitting: Obstetrics & Gynecology

## 2018-03-10 ENCOUNTER — Encounter: Payer: Self-pay | Admitting: Obstetrics & Gynecology

## 2018-03-10 ENCOUNTER — Other Ambulatory Visit: Payer: Self-pay

## 2018-03-10 VITALS — BP 100/62 | HR 109 | Ht 62.0 in | Wt 154.0 lb

## 2018-03-10 DIAGNOSIS — R87613 High grade squamous intraepithelial lesion on cytologic smear of cervix (HGSIL): Secondary | ICD-10-CM

## 2018-03-10 NOTE — Progress Notes (Signed)
Preoperative History and Physical  Cynthia Hood is a 32 y.o. [id number] with Patient's last menstrual period was 02/21/2018. admitted for a laser ablation for HSIL, large lesion.    PMH:    Past Medical History:  Diagnosis Date  . Anxiety   . Colitis, ulcerative ([place])   . Ectopic pregnancy     PSH:     Past Surgical History:  Procedure Laterality Date  . NO PAST SURGERIES      POb/GynH:      OB History    Gravida  5   Para  1   Term  1   Preterm      AB  4   Living  1     SAB  4   TAB      Ectopic      Multiple      Live Births  1           SH:   Social History   Tobacco Use  . Smoking status: Current Some Day Smoker    Packs/day: 0.50    Years: 12.00    Pack years: 6.00    Types: Cigarettes  . Smokeless tobacco: Never Used  Substance Use Topics  . Alcohol use: Yes    Comment: occ  . Drug use: No    FH:    Family History  Problem Relation Age of Onset  . Diabetes Father   . Hypertension Father   . Heart disease Father   . Cancer Maternal Grandmother        colon     Allergies: No Known Allergies  Medications:       Current Outpatient Medications:  .  Norgestimate-Ethinyl Estradiol Triphasic (TRI-SPRINTEC) 0.18/0.215/0.25 MG-35 MCG tablet, Take 1 tablet by mouth daily., Disp: 84 tablet, Rfl: 3 .  buPROPion (WELLBUTRIN SR) 150 MG 12 hr tablet, Take one tab for 3 days then take twice a day (Patient not taking: Reported on 03/01/2018), Disp: 60 tablet, Rfl: 2 .  valACYclovir (VALTREX) 1000 MG tablet, Take 0.5 tablets (500 mg total) by mouth 2 (two) times daily. (Patient not taking: Reported on 12/30/2017), Disp: 10 tablet, Rfl: 11  Review of Systems:   Review of Systems  Constitutional: Negative for fever, chills, weight loss, malaise/fatigue and diaphoresis.  HENT: Negative for hearing loss, ear pain, nosebleeds, congestion, sore throat, neck pain, tinnitus and ear discharge.   Eyes: Negative for blurred vision, double vision,  photophobia, pain, discharge and redness.  Respiratory: Negative for cough, hemoptysis, sputum production, shortness of breath, wheezing and stridor.   Cardiovascular: Negative for chest pain, palpitations, orthopnea, claudication, leg swelling and PND.  Gastrointestinal: Positive for abdominal pain. Negative for heartburn, nausea, vomiting, diarrhea, constipation, blood in stool and melena.  Genitourinary: Negative for dysuria, urgency, frequency, hematuria and flank pain.  Musculoskeletal: Negative for myalgias, back pain, joint pain and falls.  Skin: Negative for itching and rash.  Neurological: Negative for dizziness, tingling, tremors, sensory change, speech change, focal weakness, seizures, loss of consciousness, weakness and headaches.  Endo/Heme/Allergies: Negative for environmental allergies and polydipsia. Does not bruise/bleed easily.  Psychiatric/Behavioral: Negative for depression, suicidal ideas, hallucinations, memory loss and substance abuse. The patient is not nervous/anxious and does not have insomnia.      PHYSICAL EXAM:  Blood pressure 100/62, pulse (!) 109, height 5\' 2"  (1.575 m), weight 154 lb (69.9 kg), last menstrual period 02/21/2018.    Vitals reviewed. Constitutional: She is oriented to person, place, and time. She appears  well-developed and well-nourished.  HENT:  Head: Normocephalic and atraumatic.  Right Ear: External ear normal.  Left Ear: External ear normal.  Nose: Nose normal.  Mouth/Throat: Oropharynx is clear and moist.  Eyes: Conjunctivae and EOM are normal. Pupils are equal, round, and reactive to light. Right eye exhibits no discharge. Left eye exhibits no discharge. No scleral icterus.  Neck: Normal range of motion. Neck supple. No tracheal deviation present. No thyromegaly present.  Cardiovascular: Normal rate, regular rhythm, normal heart sounds and intact distal pulses.  Exam reveals no gallop and no friction rub.   No murmur  heard. Respiratory: Effort normal and breath sounds normal. No respiratory distress. She has no wheezes. She has no rales. She exhibits no tenderness.  GI: Soft. Bowel sounds are normal. She exhibits no distension and no mass. There is tenderness. There is no rebound and no guarding.  Genitourinary:       Vulva is normal without lesions Vagina is pink moist without discharge Cervix see colpo note Uterus is normal size, contour, position, consistency, mobility, non-tender Adnexa is negative with normal sized ovaries by sonogram  Musculoskeletal: Normal range of motion. She exhibits no edema and no tenderness.  Neurological: She is alert and oriented to person, place, and time. She has normal reflexes. She displays normal reflexes. No cranial nerve deficit. She exhibits normal muscle tone. Coordination normal.  Skin: Skin is warm and dry. No rash noted. No erythema. No pallor.  Psychiatric: She has a normal mood and affect. Her behavior is normal. Judgment and thought content normal.    Labs: Results for orders placed or performed in visit on 03/01/18 (from the past 336 hour(s))  POCT urine pregnancy   Collection Time: 03/01/18  1:40 PM  Result Value Ref Range   Preg Test, Ur Negative Negative    EKG: Orders placed or performed during the hospital encounter of 10/26/13  . ED EKG  . ED EKG  . ED EKG  . ED EKG  . EKG 12-Lead  . EKG 12-Lead  . EKG    Imaging Studies: No results found.    Assessment: HSIL   Patient Active Problem List   Diagnosis Date Noted  . Adjustment disorder with mixed anxiety and depressed mood 06/24/2016  . Anxiety 08/14/2014  . Neck pain on left side 04/06/2014  . Left-sided thoracic back pain 04/06/2014  . Umbilical cord, single artery and vein 03/09/2014  . Supervision of low-risk pregnancy 02/09/2014  . Round ligament pain 02/09/2014  . Supervision of other normal pregnancy 12/18/2013  . 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

## 2018-03-30 ENCOUNTER — Other Ambulatory Visit: Payer: Self-pay | Admitting: Obstetrics & Gynecology

## 2018-03-30 NOTE — Patient Instructions (Signed)
Cynthia Hood  03/30/2018     @PREFPERIOPPHARMACY @   Your procedure is scheduled on  04/06/2018   Report to [facility] at  900   A.M.  Call this number if you have problems the morning of surgery:  [phone]   Remember:  Do not eat food or drink liquids after midnight.  Take these medicines the morning of surgery with A SIP OF WATER  None   Do not wear jewelry, make-up or nail polish.  Do not wear lotions, powders, or perfumes, or deodorant.  Do not shave 48 hours prior to surgery.  Men may shave face and neck.  Do not bring valuables to the hospital.  [facility] is not responsible for any belongings or valuables.  Contacts, dentures or bridgework may not be worn into surgery.  Leave your suitcase in the car.  After surgery it may be brought to your room.  For patients admitted to the hospital, discharge time will be determined by your treatment team.  Patients discharged the day of surgery will not be allowed to drive home.   Name and phone number of your driver:   family Special instructions:  None  Please read over the following fact sheets that you were given. Anesthesia Post-op Instructions and Care and Recovery After Surgery       Cervical Laser Surgery Cervical laser surgery is a procedure that uses a focused beam of light (laser) to shrink, destroy, or remove tissue from the opening of the uterus (cervix). You may have this surgery if:  You have abnormal cells (dysplasia) in your cervix that are an early sign of cancer.  Tissue from your cervix is to be tested for signs of disease.  Tell a health care provider about:  Any allergies you have.  All medicines you are taking, including vitamins, herbs, eye drops, creams, and over-the-counter medicines.  Any problems you or family members have had with anesthetic medicines.  Any blood disorders you have.  Any surgeries you have had.  Any medical conditions you have.  Whether you are  pregnant or may be pregnant. What are the risks? Generally, this is a safe procedure. However, problems may occur, including:  Bleeding.  Infection.  Allergic reactions to medicines or dyes.  Damage to other structures or organs.  Narrowing of the cervix (cervical stenosis). This can lead to infertility, cervical incompetency, and miscarriage or preterm labor in a future pregnancy.  What happens before the procedure?  Ask your health care provider about changing or stopping your regular medicines. This is especially important if you are taking diabetes medicines or blood thinners.  For at least 24 hours before the procedure: ? Do not have sex. ? Do not use tampons. ? Do not douche. ? Do not use vaginal creams or medicines. What happens during the procedure?  To lower your risk of infection, your health care team will wash or sanitize their hands.  You will lie on your back with your feet raised in footrests (stirrups).  A device called a speculum will be put into your vagina to hold it open.  You will be given a medicine to numb the area (local anesthetic). You may feel cramping when the medicine is injected.  A magnifying instrument called a colposcope will be placed into your vagina. It will shine a light and allow your health care provider to see your vagina and cervix.  A solution will be swabbed  on your cervix to help your health care provider see the tissue.  A thin, flexible tube called an endoscope will be inserted into your vagina. The laser will be on the end of the endoscope.  The laser will be used to shrink, destroy, or remove the tissue.  A cotton swab soaked in solution may be used to remove any burned or destroyed tissue.  A paste may be applied to your cervix to stop bleeding. The procedure may vary among health care providers and hospitals. What happens after the procedure?  You may feel some pain or cramping for a few days.  You may have some  bleeding and brownish discharge.  Wear sanitary pads for discharge.  Do not have sex or put anything in your vagina until your health care provider says it is okay.  Your health care provider may recommend that you limit your physical activity for a few days. Ask your health care provider what activities are safe for you. Summary  Cervical laser surgery is a procedure that uses a focused beam of light (laser) to shrink, destroy, or remove tissue from the cervix.  You may have this procedure if you have abnormal cells in your cervix that are an early sign of cancer, or if tissue from your cervix is to be tested for signs of disease.  Generally, this is a safe procedure. However, problems may occur.  Do not have sex, use tampons, douche, or use vaginal creams or medicines for at least 24 hours before the procedure.  You may have pain, cramping, bleeding, and discharge for a few days after the procedure. This information is not intended to replace advice given to you by your health care provider. Make sure you discuss any questions you have with your health care provider. Document Released: 09/28/2016 Document Revised: 09/28/2016 Document Reviewed: 09/28/2016 Elsevier Interactive Patient Education  2018 [place].  Cervical Laser Surgery, Care After This sheet gives you information about how to care for yourself after your procedure. Your health care provider may also give you more specific instructions. If you have problems or questions, contact your health care provider. What can I expect after the procedure? After the procedure, it is common to have:  Pain or discomfort.  Mild cramping.  Bleeding, spotting, or brownish discharge from your vagina.  Follow these instructions at home: Activity  Return to your normal activities as told by your health care provider. Ask your health care provider what activities are safe for you.  Do not lift anything that is heavier than 10 lb  (4.5 kg), or the limit that your health care provider tells you, until he or she says that it is safe.  Do not have sex or put anything in your vagina until your health care provider says it is okay. General instructions  Take over-the-counter and prescription medicines only as told by your health care provider.  Do not drive or use heavy machinery while taking prescription pain medicine.  Wear sanitary pads to protect from bleeding, spotting, and discharge.  Do not use tampons or douche until your health care provider says it is okay.  It is up to you to get the results of your procedure. Ask your health care provider, or the department that is doing the procedure, when your results will be ready.  Keep all follow-up visits as told by your health care provider. This is important. Contact a health care provider if:  Your pain or cramping does not improve.  Your  periods are more painful than usual.  You do not get your period as expected. Get help right away if:  You have any symptoms of infection, such as: ? A fever. ? Chills. ? Discharge that smells bad.  You have severe pain in your abdomen.  You have heavy bleeding from your vagina (more than a normal period).  You have vaginal bleeding with clumps of blood (blood clots). Summary  After this procedure, it is common to have pain or discomfort and mild cramping. It is also common to have bleeding, spotting, or brownish discharge from your vagina.  You may need to wear sanitary pads to protect from bleeding, spotting, and discharge.  Do not have sex, use tampons, or douche until your health care provider says it is okay.  Return to your normal activities as told by your health care provider. Ask your health care provider what activities are safe for you.  Take over-the-counter and prescription medicines only as told by your health care provider. These include medicines for pain. This information is not intended to  replace advice given to you by your health care provider. Make sure you discuss any questions you have with your health care provider. Document Released: 09/28/2016 Document Revised: 09/28/2016 Document Reviewed: 09/28/2016 Elsevier Interactive Patient Education  2018 [place] Anesthesia, Adult General anesthesia is the use of medicines to make a person "go to sleep" (be unconscious) for a medical procedure. General anesthesia is often recommended when a procedure:  Is long.  Requires you to be still or in an unusual position.  Is major and can cause you to lose blood.  Is impossible to do without general anesthesia.  The medicines used for general anesthesia are called general anesthetics. In addition to making you sleep, the medicines:  Prevent pain.  Control your blood pressure.  Relax your muscles.  Tell a health care provider about:  Any allergies you have.  All medicines you are taking, including vitamins, herbs, eye drops, creams, and over-the-counter medicines.  Any problems you or family members have had with anesthetic medicines.  Types of anesthetics you have had in the past.  Any bleeding disorders you have.  Any surgeries you have had.  Any medical conditions you have.  Any history of heart or lung conditions, such as heart failure, sleep apnea, or chronic obstructive pulmonary disease (COPD).  Whether you are pregnant or may be pregnant.  Whether you use tobacco, alcohol, marijuana, or street drugs.  Any history of Armed forces logistics/support/administrative officer.  Any history of depression or anxiety. What are the risks? Generally, this is a safe procedure. However, problems may occur, including:  Allergic reaction to anesthetics.  Lung and heart problems.  Inhaling food or liquids from your stomach into your lungs (aspiration).  Injury to nerves.  Waking up during your procedure and being unable to move (rare).  Extreme agitation or a state of mental  confusion (delirium) when you wake up from the anesthetic.  Air in the bloodstream, which can lead to stroke.  These problems are more likely to develop if you are having a major surgery or if you have an advanced medical condition. You can prevent some of these complications by answering all of your health care provider's questions thoroughly and by following all pre-procedure instructions. General anesthesia can cause side effects, including:  Nausea or vomiting  A sore throat from the breathing tube.  Feeling cold or shivery.  Feeling tired, washed out, or achy.  Sleepiness or drowsiness.  Confusion or agitation.  What happens before the procedure? Staying hydrated Follow instructions from your health care provider about hydration, which may include:  Up to 2 hours before the procedure - you may continue to drink clear liquids, such as water, clear fruit juice, black coffee, and plain tea.  Eating and drinking restrictions Follow instructions from your health care provider about eating and drinking, which may include:  8 hours before the procedure - stop eating heavy meals or foods such as meat, fried foods, or fatty foods.  6 hours before the procedure - stop eating light meals or foods, such as toast or cereal.  6 hours before the procedure - stop drinking milk or drinks that contain milk.  2 hours before the procedure - stop drinking clear liquids.  Medicines  Ask your health care provider about: ? Changing or stopping your regular medicines. This is especially important if you are taking diabetes medicines or blood thinners. ? Taking medicines such as aspirin and ibuprofen. These medicines can thin your blood. Do not take these medicines before your procedure if your health care provider instructs you not to. ? Taking new dietary supplements or medicines. Do not take these during the week before your procedure unless your health care provider approves them.  If you  are told to take a medicine or to continue taking a medicine on the day of the procedure, take the medicine with sips of water. General instructions   Ask if you will be going home the same day, the following day, or after a longer hospital stay. ? Plan to have someone take you home. ? Plan to have someone stay with you for the first 24 hours after you leave the hospital or clinic.  For 3-6 weeks before the procedure, try not to use any tobacco products, such as cigarettes, chewing tobacco, and e-cigarettes.  You may brush your teeth on the morning of the procedure, but make sure to spit out the toothpaste. What happens during the procedure?  You will be given anesthetics through a mask and through an IV tube in one of your veins.  You may receive medicine to help you relax (sedative).  As soon as you are asleep, a breathing tube may be used to help you breathe.  An anesthesia specialist will stay with you throughout the procedure. He or she will help keep you comfortable and safe by continuing to give you medicines and adjusting the amount of medicine that you get. He or she will also watch your blood pressure, pulse, and oxygen levels to make sure that the anesthetics do not cause any problems.  If a breathing tube was used to help you breathe, it will be removed before you wake up. The procedure may vary among health care providers and hospitals. What happens after the procedure?  You will wake up, often slowly, after the procedure is complete, usually in a recovery area.  Your blood pressure, heart rate, breathing rate, and blood oxygen level will be monitored until the medicines you were given have worn off.  You may be given medicine to help you calm down if you feel anxious or agitated.  If you will be going home the same day, your health care provider may check to make sure you can stand, drink, and urinate.  Your health care providers will treat your pain and side effects  before you go home.  Do not drive for 24 hours if you received a sedative.  You may: ?  Feel nauseous and vomit. ? Have a sore throat. ? Have mental slowness. ? Feel cold or shivery. ? Feel sleepy. ? Feel tired. ? Feel sore or achy, even in parts of your body where you did not have surgery. This information is not intended to replace advice given to you by your health care provider. Make sure you discuss any questions you have with your health care provider. Document Released: 02/16/2008 Document Revised: 04/21/2016 Document Reviewed: 10/24/2015 Elsevier Interactive Patient Education  2018 [place] Anesthesia, Adult, Care After These instructions provide you with information about caring for yourself after your procedure. Your health care provider may also give you more specific instructions. Your treatment has been planned according to current medical practices, but problems sometimes occur. Call your health care provider if you have any problems or questions after your procedure. What can I expect after the procedure? After the procedure, it is common to have:  Vomiting.  A sore throat.  Mental slowness.  It is common to feel:  Nauseous.  Cold or shivery.  Sleepy.  Tired.  Sore or achy, even in parts of your body where you did not have surgery.  Follow these instructions at home: For at least 24 hours after the procedure:  Do not: ? Participate in activities where you could fall or become injured. ? Drive. ? Use heavy machinery. ? Drink alcohol. ? Take sleeping pills or medicines that cause drowsiness. ? Make important decisions or sign legal documents. ? Take care of children on your own.  Rest. Eating and drinking  If you vomit, drink water, juice, or soup when you can drink without vomiting.  Drink enough fluid to keep your urine clear or pale yellow.  Make sure you have little or no nausea before eating solid foods.  Follow the diet  recommended by your health care provider. General instructions  Have a responsible adult stay with you until you are awake and alert.  Return to your normal activities as told by your health care provider. Ask your health care provider what activities are safe for you.  Take over-the-counter and prescription medicines only as told by your health care provider.  If you smoke, do not smoke without supervision.  Keep all follow-up visits as told by your health care provider. This is important. Contact a health care provider if:  You continue to have nausea or vomiting at home, and medicines are not helpful.  You cannot drink fluids or start eating again.  You cannot urinate after 8-12 hours.  You develop a skin rash.  You have fever.  You have increasing redness at the site of your procedure. Get help right away if:  You have difficulty breathing.  You have chest pain.  You have unexpected bleeding.  You feel that you are having a life-threatening or urgent problem. This information is not intended to replace advice given to you by your health care provider. Make sure you discuss any questions you have with your health care provider. Document Released: 02/15/2001 Document Revised: 04/13/2016 Document Reviewed: 10/24/2015 Elsevier Interactive Patient Education  [employer].

## 2018-04-04 ENCOUNTER — Encounter (HOSPITAL_COMMUNITY): Payer: Self-pay

## 2018-04-04 ENCOUNTER — Encounter (HOSPITAL_COMMUNITY)
Admission: RE | Admit: 2018-04-04 | Discharge: 2018-04-04 | Disposition: A | Discharge status: Home / Self Care | Payer: 59 | Source: Ambulatory Visit | Attending: Obstetrics & Gynecology | Admitting: Obstetrics & Gynecology

## 2018-04-04 ENCOUNTER — Other Ambulatory Visit: Payer: Self-pay

## 2018-04-04 DIAGNOSIS — R87613 High grade squamous intraepithelial lesion on cytologic smear of cervix (HGSIL): Secondary | ICD-10-CM | POA: Diagnosis not present

## 2018-04-04 DIAGNOSIS — F17210 Nicotine dependence, cigarettes, uncomplicated: Secondary | ICD-10-CM | POA: Diagnosis not present

## 2018-04-04 LAB — COMPREHENSIVE METABOLIC PANEL
ALT: 30 U/L (ref 14–54)
AST: 16 U/L (ref 15–41)
Albumin: 3.9 g/dL (ref 3.5–5.0)
Alkaline Phosphatase: 73 U/L (ref 38–126)
Anion gap: 8 (ref 5–15)
BUN: 17 mg/dL (ref 6–20)
CO2: 28 mmol/L (ref 22–32)
CREATININE: 0.66 mg/dL (ref 0.44–1.00)
Calcium: 8.8 mg/dL — ABNORMAL LOW (ref 8.9–10.3)
Chloride: 99 mmol/L — ABNORMAL LOW (ref 101–111)
GFR calc Af Amer: >60 mL/min (ref >60)
Glucose, Bld: 120 mg/dL — ABNORMAL HIGH (ref 65–99)
Potassium: 3.3 mmol/L — ABNORMAL LOW (ref 3.5–5.1)
SODIUM: 135 mmol/L (ref 135–145)
TOTAL PROTEIN: 7.3 g/dL (ref 6.5–8.1)
Total Bilirubin: 0.6 mg/dL (ref 0.3–1.2)

## 2018-04-04 LAB — RAPID HIV SCREEN (HIV 1/2 AB+AG)
HIV 1/2 Antibodies: NONREACTIVE
HIV-1 P24 Antigen - HIV24: NONREACTIVE

## 2018-04-04 LAB — CBC
HCT: 41.9 % (ref 36.0–46.0)
Hemoglobin: 14 g/dL (ref 12.0–15.0)
MCH: 30.2 pg (ref 26.0–34.0)
MCHC: 33.4 g/dL (ref 30.0–36.0)
MCV: 90.5 fL (ref 78.0–100.0)
PLATELETS: 183 10*3/uL (ref 150–400)
RBC: 4.63 MIL/uL (ref 3.87–5.11)
RDW: 12 % (ref 11.5–15.5)
WBC: 13.2 10*3/uL — ABNORMAL HIGH (ref 4.0–10.5)

## 2018-04-04 LAB — URINALYSIS, ROUTINE W REFLEX MICROSCOPIC
Bacteria, UA: NONE SEEN
Bilirubin Urine: NEGATIVE
GLUCOSE, UA: NEGATIVE mg/dL
KETONES UR: NEGATIVE mg/dL
Leukocytes, UA: NEGATIVE
Nitrite: NEGATIVE
PROTEIN: NEGATIVE mg/dL
Specific Gravity, Urine: 1.02 (ref 1.005–1.030)
pH: 5 (ref 5.0–8.0)

## 2018-04-04 LAB — HCG, QUANTITATIVE, PREGNANCY

## 2018-04-05 ENCOUNTER — Telehealth: Payer: Self-pay | Admitting: Obstetrics & Gynecology

## 2018-04-05 NOTE — Telephone Encounter (Signed)
Having surgery tomorrow and has some questions she needs to discuss

## 2018-04-05 NOTE — Telephone Encounter (Signed)
Patient states she is scheduled for surgery tomorrow but is concerned since she has chest congestion, a runny nose and a sore throat.  When she went for her pre-op, they stated it should be fine but to double check with Dr Elonda Husky.  Spoke with him who stated that was ok and she could still have her surgery.  Pt verbalized understanding.

## 2018-04-06 ENCOUNTER — Encounter (HOSPITAL_COMMUNITY): Payer: Self-pay

## 2018-04-06 ENCOUNTER — Ambulatory Visit (HOSPITAL_COMMUNITY)
Admission: RE | Admit: 2018-04-06 | Discharge: 2018-04-06 | Disposition: A | Discharge status: Home / Self Care | Payer: 59 | Source: Ambulatory Visit | Attending: Obstetrics & Gynecology | Admitting: Obstetrics & Gynecology

## 2018-04-06 ENCOUNTER — Ambulatory Visit (HOSPITAL_COMMUNITY): Payer: 59 | Admitting: Anesthesiology

## 2018-04-06 ENCOUNTER — Encounter (HOSPITAL_COMMUNITY)
Admission: RE | Disposition: A | Discharge status: Home / Self Care | Payer: Self-pay | Source: Ambulatory Visit | Attending: Obstetrics & Gynecology

## 2018-04-06 DIAGNOSIS — F17210 Nicotine dependence, cigarettes, uncomplicated: Secondary | ICD-10-CM | POA: Diagnosis not present

## 2018-04-06 DIAGNOSIS — R87613 High grade squamous intraepithelial lesion on cytologic smear of cervix (HGSIL): Secondary | ICD-10-CM | POA: Insufficient documentation

## 2018-04-06 DIAGNOSIS — N871 Moderate cervical dysplasia: Secondary | ICD-10-CM | POA: Diagnosis not present

## 2018-04-06 HISTORY — PX: CERVICAL ABLATION: SHX5771

## 2018-04-06 SURGERY — ABLATION, CERVIX
Anesthesia: General

## 2018-04-06 MED ORDER — HYDROCODONE-ACETAMINOPHEN 7.5-325 MG PO TABS
1.0000 | ORAL_TABLET | Freq: Once | ORAL | Status: AC | PRN
  Administered 2018-04-06: 1 via ORAL
  Filled 2018-04-06: qty 1

## 2018-04-06 MED ORDER — LIDOCAINE HCL (PF) 1 % IJ SOLN
INTRAMUSCULAR | Status: AC
Start: 2018-04-06 — End: ?
  Filled 2018-04-06: qty 15

## 2018-04-06 MED ORDER — PROPOFOL 10 MG/ML IV BOLUS
INTRAVENOUS | Status: DC | PRN
  Administered 2018-04-06: 150 mg via INTRAVENOUS

## 2018-04-06 MED ORDER — ACETIC ACID 5 % SOLN
Status: DC | PRN
  Administered 2018-04-06: 1 via TOPICAL

## 2018-04-06 MED ORDER — STERILE WATER FOR IRRIGATION IR SOLN
Status: DC | PRN
  Administered 2018-04-06: 1000 mL

## 2018-04-06 MED ORDER — FERRIC SUBSULFATE 259 MG/GM EX SOLN
CUTANEOUS | Status: AC
  Filled 2018-04-06: qty 8

## 2018-04-06 MED ORDER — FENTANYL CITRATE (PF) 100 MCG/2ML IJ SOLN
INTRAMUSCULAR | Status: AC
  Filled 2018-04-06: qty 2

## 2018-04-06 MED ORDER — PHENYLEPHRINE 40 MCG/ML (10ML) SYRINGE FOR IV PUSH (FOR BLOOD PRESSURE SUPPORT)
PREFILLED_SYRINGE | INTRAVENOUS | Status: AC
  Filled 2018-04-06: qty 10

## 2018-04-06 MED ORDER — FERRIC SUBSULFATE 259 MG/GM EX SOLN
CUTANEOUS | Status: DC | PRN
  Administered 2018-04-06: 1 via TOPICAL

## 2018-04-06 MED ORDER — LACTATED RINGERS IV SOLN
INTRAVENOUS | Status: DC | PRN
  Administered 2018-04-06: 10:00:00 via INTRAVENOUS

## 2018-04-06 MED ORDER — LIDOCAINE HCL (CARDIAC) PF 100 MG/5ML IV SOSY
PREFILLED_SYRINGE | INTRAVENOUS | Status: DC | PRN
  Administered 2018-04-06: 40 mg via INTRAVENOUS

## 2018-04-06 MED ORDER — HYDROCODONE-ACETAMINOPHEN 5-325 MG PO TABS: 1.0000 | ORAL_TABLET | Freq: Four times a day (QID) | ORAL | 0 refills | Status: DC | PRN

## 2018-04-06 MED ORDER — KETOROLAC TROMETHAMINE 30 MG/ML IJ SOLN
30.0000 mg | Freq: Once | INTRAMUSCULAR | Status: AC
  Administered 2018-04-06: 30 mg via INTRAVENOUS
  Filled 2018-04-06: qty 1

## 2018-04-06 MED ORDER — ONDANSETRON 8 MG PO TBDP: 8.0000 mg | ORAL_TABLET | Freq: Three times a day (TID) | ORAL | 0 refills | Status: DC | PRN

## 2018-04-06 MED ORDER — MIDAZOLAM HCL 5 MG/5ML IJ SOLN
INTRAMUSCULAR | Status: DC | PRN
  Administered 2018-04-06: 2 mg via INTRAVENOUS

## 2018-04-06 MED ORDER — KETOROLAC TROMETHAMINE 30 MG/ML IJ SOLN: 30.0000 mg | Freq: Once | INTRAMUSCULAR | Status: DC | PRN

## 2018-04-06 MED ORDER — ONDANSETRON HCL 4 MG/2ML IJ SOLN: 4.0000 mg | Freq: Once | INTRAMUSCULAR | Status: DC | PRN

## 2018-04-06 MED ORDER — MEPERIDINE HCL 50 MG/ML IJ SOLN: 6.2500 mg | INTRAMUSCULAR | Status: DC | PRN

## 2018-04-06 MED ORDER — CEFAZOLIN SODIUM-DEXTROSE 2-4 GM/100ML-% IV SOLN
2.0000 g | INTRAVENOUS | Status: AC
  Administered 2018-04-06: 2 g via INTRAVENOUS
  Filled 2018-04-06: qty 100

## 2018-04-06 MED ORDER — SODIUM CHLORIDE 0.9 % IJ SOLN
INTRAMUSCULAR | Status: AC
  Filled 2018-04-06: qty 40

## 2018-04-06 MED ORDER — EPHEDRINE SULFATE 50 MG/ML IJ SOLN
INTRAMUSCULAR | Status: AC
  Filled 2018-04-06: qty 2

## 2018-04-06 MED ORDER — ONDANSETRON HCL 4 MG/2ML IJ SOLN
INTRAMUSCULAR | Status: AC
Start: 2018-04-06 — End: ?
  Filled 2018-04-06: qty 2

## 2018-04-06 MED ORDER — FENTANYL CITRATE (PF) 100 MCG/2ML IJ SOLN
INTRAMUSCULAR | Status: DC | PRN
  Administered 2018-04-06: 25 ug via INTRAVENOUS
  Administered 2018-04-06: 50 ug via INTRAVENOUS
  Administered 2018-04-06: 25 ug via INTRAVENOUS

## 2018-04-06 MED ORDER — DEXAMETHASONE SODIUM PHOSPHATE 4 MG/ML IJ SOLN
INTRAMUSCULAR | Status: AC
  Filled 2018-04-06: qty 1

## 2018-04-06 MED ORDER — DEXAMETHASONE SODIUM PHOSPHATE 4 MG/ML IJ SOLN
INTRAMUSCULAR | Status: DC | PRN
  Administered 2018-04-06: 4 mg via INTRAVENOUS

## 2018-04-06 MED ORDER — HYDROMORPHONE HCL 1 MG/ML IJ SOLN: 0.2500 mg | INTRAMUSCULAR | Status: DC | PRN

## 2018-04-06 MED ORDER — KETOROLAC TROMETHAMINE 10 MG PO TABS: 10.0000 mg | ORAL_TABLET | Freq: Three times a day (TID) | ORAL | 0 refills | Status: DC | PRN

## 2018-04-06 MED ORDER — ONDANSETRON HCL 4 MG/2ML IJ SOLN
INTRAMUSCULAR | Status: DC | PRN
  Administered 2018-04-06: 4 mg via INTRAVENOUS

## 2018-04-06 MED ORDER — FENTANYL CITRATE (PF) 250 MCG/5ML IJ SOLN
INTRAMUSCULAR | Status: AC
  Filled 2018-04-06: qty 5

## 2018-04-06 MED ORDER — ACETIC ACID 4% SOLUTION: Status: DC | PRN

## 2018-04-06 SURGICAL SUPPLY — 27 items
APL FBRTP 16 NS LF PRCTSCP (MISCELLANEOUS) ×1
APL SWBSTK 6 STRL LF DISP (MISCELLANEOUS) ×1
APPLICATOR COTTON TIP 6 STRL (MISCELLANEOUS) ×1 IMPLANT
APPLICATOR COTTON TIP 6IN STRL (MISCELLANEOUS) ×3
CLOTH BEACON ORANGE TIMEOUT ST (SAFETY) ×3 IMPLANT
COVER LIGHT HANDLE STERIS (MISCELLANEOUS) ×6 IMPLANT
COVER MAYO STAND XLG (DRAPE) ×3 IMPLANT
COVER TABLE BACK 60X90 (DRAPES) ×3 IMPLANT
GAUZE SPONGE 4X4 16PLY XRAY LF (GAUZE/BANDAGES/DRESSINGS) ×3 IMPLANT
GLOVE BIOGEL PI IND STRL 7.0 (GLOVE) ×1 IMPLANT
GLOVE BIOGEL PI IND STRL 8 (GLOVE) ×1 IMPLANT
GLOVE BIOGEL PI INDICATOR 7.0 (GLOVE) ×2
GLOVE BIOGEL PI INDICATOR 8 (GLOVE) ×2
GLOVE ECLIPSE 8.0 STRL XLNG CF (GLOVE) ×3 IMPLANT
GOWN STRL REUS W/TWL LRG LVL3 (GOWN DISPOSABLE) ×3 IMPLANT
GOWN STRL REUS W/TWL XL LVL3 (GOWN DISPOSABLE) ×3 IMPLANT
KIT TURNOVER KIT A (KITS) ×3 IMPLANT
LASER FIBER DISP 1000U (UROLOGICAL SUPPLIES) ×3 IMPLANT
MANIFOLD NEPTUNE II (INSTRUMENTS) ×3 IMPLANT
MARKER SKIN DUAL TIP RULER LAB (MISCELLANEOUS) ×3 IMPLANT
PAD ARMBOARD 7.5X6 YLW CONV (MISCELLANEOUS) ×3 IMPLANT
PREFILTER SMOKE EVAC (FILTER) ×3 IMPLANT
SET BASIN LINEN APH (SET/KITS/TRAYS/PACK) ×3 IMPLANT
SHEET LAVH (DRAPES) ×3 IMPLANT
SWAB PROCTOSCOPIC (MISCELLANEOUS) ×3 IMPLANT
TUBING SMOKE EVAC CO2 (TUBING) ×3 IMPLANT
WATER STERILE IRR 1000ML POUR (IV SOLUTION) ×3 IMPLANT

## 2018-04-06 NOTE — Anesthesia Postprocedure Evaluation (Signed)
Anesthesia Post Note  Patient: Cynthia Hood  Procedure(s) Performed: LASER ABLATION OF THE CERVIX (N/A )  Patient location during evaluation: PACU Anesthesia Type: General Level of consciousness: awake and alert, oriented and patient cooperative Pain management: pain level controlled Vital Signs Assessment: post-procedure vital signs reviewed and stable Respiratory status: spontaneous breathing and respiratory function stable Cardiovascular status: stable Postop Assessment: no apparent nausea or vomiting Anesthetic complications: no     Last Vitals:  Vitals:   04/06/18 0852  BP: 106/72  Pulse: 90  Temp: 36.9 C  SpO2: 97%    Last Pain:  Vitals:   04/06/18 0852  TempSrc: Oral  PainSc: 0-No pain                 Ninoshka Wainwright A

## 2018-04-06 NOTE — Op Note (Signed)
Preoperative Diagnosis:  High Grade Squamous Intraepithelial lesion, adequate colposcopy, large lesion extending high anteriorly on the cervix  Postoperative Diagnosis:  Same as above  Procedure:  Laser ablation of the cervix  Surgeon:  Florian Buff MD  Anaesthesia:  Laryngeal Mask Airway  Findings:  Patient had an abnormal pap smear which was evaluated in the office with colposcopy and directed biopsies.  Pathology report returned as high Grade SIL.  The colposcopy was adequate.  As a result, the patient is admitted for laser ablation of the cervix.  Description of Note:  Patient was taken to the OR and placed in the supine position where she underwent laryngeal mask airway anaesthesia.  She was placed in the dorsal lithotomy position.  She was draped for laser.  Graves speculum was placed and 3% acetic acid used and the laser microscope employed to perform colposcopy which confirmed the office findings.  Laser was used on typical cervical settings and used to vaporized the squamocolumnar junction to  depth of  5-7 mm peripherally and 7-9 mm centrally.  Surgical margin of several mm was employed beyond the acetowhite epithelium.  Hemostasis was achieved with the laser and Monsel's solution.  Patient was awakened from anaesthesia in good stable condition and all counts were correct.  She received Ancef 2 gram and Toradol 30 mg IV preoperatively prophylactically.   Florian Buff, MD 04/06/2018 10:35 AM

## 2018-04-06 NOTE — Discharge Instructions (Signed)
Cervical Laser Surgery, Care After This sheet gives you information about how to care for yourself after your procedure. Your health care provider may also give you more specific instructions. If you have problems or questions, contact your health care provider. What can I expect after the procedure? After the procedure, it is common to have:  Pain or discomfort.  Mild cramping.  Bleeding, spotting, or brownish discharge from your vagina.  Follow these instructions at home: Activity  Return to your normal activities as told by your health care provider. Ask your health care provider what activities are safe for you.  Do not lift anything that is heavier than 10 lb (4.5 kg), or the limit that your health care provider tells you, until he or she says that it is safe.  Do not have sex or put anything in your vagina until your health care provider says it is okay. General instructions  Take over-the-counter and prescription medicines only as told by your health care provider.  Do not drive or use heavy machinery while taking prescription pain medicine.  Wear sanitary pads to protect from bleeding, spotting, and discharge.  Do not use tampons or douche until your health care provider says it is okay.  It is up to you to get the results of your procedure. Ask your health care provider, or the department that is doing the procedure, when your results will be ready.  Keep all follow-up visits as told by your health care provider. This is important. Contact a health care provider if:  Your pain or cramping does not improve.  Your periods are more painful than usual.  You do not get your period as expected. Get help right away if:  You have any symptoms of infection, such as: ? A fever. ? Chills. ? Discharge that smells bad.  You have severe pain in your abdomen.  You have heavy bleeding from your vagina (more than a normal period).  You have vaginal bleeding with clumps of  blood (blood clots). Summary  After this procedure, it is common to have pain or discomfort and mild cramping. It is also common to have bleeding, spotting, or brownish discharge from your vagina.  You may need to wear sanitary pads to protect from bleeding, spotting, and discharge.  Do not have sex, use tampons, or douche until your health care provider says it is okay.  Return to your normal activities as told by your health care provider. Ask your health care provider what activities are safe for you.  Take over-the-counter and prescription medicines only as told by your health care provider. These include medicines for pain. This information is not intended to replace advice given to you by your health care provider. Make sure you discuss any questions you have with your health care provider. Document Released: 09/28/2016 Document Revised: 09/28/2016 Document Reviewed: 09/28/2016 Elsevier Interactive Patient Education  [employer].

## 2018-04-06 NOTE — H&P (Signed)
Preoperative History and Physical  Cynthia Hood is a 32 y.o. [phone] with Patient's last menstrual period was 03/23/2018. admitted for a colposcopically directed high grade cervical dysplasia biopsy.  Adequate colposcopy but very large lesion suitable for laser ablation.  PMH:    Past Medical History:  Diagnosis Date  . Anxiety   . Colitis, ulcerative ([place])   . Ectopic pregnancy     PSH:     Past Surgical History:  Procedure Laterality Date  . NO PAST SURGERIES      POb/GynH:      OB History    Gravida  5   Para  1   Term  1   Preterm      AB  4   Living  1     SAB  4   TAB      Ectopic      Multiple      Live Births  1           SH:   Social History   Tobacco Use  . Smoking status: Current Some Day Smoker    Packs/day: 0.50    Years: 12.00    Pack years: 6.00    Types: Cigarettes  . Smokeless tobacco: Never Used  Substance Use Topics  . Alcohol use: Yes    Comment: occ  . Drug use: No    FH:    Family History  Problem Relation Age of Onset  . Diabetes Father   . Hypertension Father   . Heart disease Father   . Cancer Maternal Grandmother        colon     Allergies: No Known Allergies  Medications:       Current Facility-Administered Medications:  .  ceFAZolin (ANCEF) IVPB 2g/100 mL premix, 2 g, Intravenous, On Call to OR, Florian Buff, MD  Review of Systems:   Review of Systems  Constitutional: Negative for fever, chills, weight loss, malaise/fatigue and diaphoresis.  HENT: Negative for hearing loss, ear pain, nosebleeds, congestion, sore throat, neck pain, tinnitus and ear discharge.   Eyes: Negative for blurred vision, double vision, photophobia, pain, discharge and redness.  Respiratory: Negative for cough, hemoptysis, sputum production, shortness of breath, wheezing and stridor.   Cardiovascular: Negative for chest pain, palpitations, orthopnea, claudication, leg swelling and PND.  Gastrointestinal: Positive for  abdominal pain. Negative for heartburn, nausea, vomiting, diarrhea, constipation, blood in stool and melena.  Genitourinary: Negative for dysuria, urgency, frequency, hematuria and flank pain.  Musculoskeletal: Negative for myalgias, back pain, joint pain and falls.  Skin: Negative for itching and rash.  Neurological: Negative for dizziness, tingling, tremors, sensory change, speech change, focal weakness, seizures, loss of consciousness, weakness and headaches.  Endo/Heme/Allergies: Negative for environmental allergies and polydipsia. Does not bruise/bleed easily.  Psychiatric/Behavioral: Negative for depression, suicidal ideas, hallucinations, memory loss and substance abuse. The patient is not nervous/anxious and does not have insomnia.      PHYSICAL EXAM:  Blood pressure 106/72, pulse 90, temperature 98.4 F (36.9 C), temperature source Oral, height 5\' 2"  (1.575 m), weight 157 lb (71.2 kg), last menstrual period 03/23/2018, SpO2 97 %.    Vitals reviewed. Constitutional: She is oriented to person, place, and time. She appears well-developed and well-nourished.  HENT:  Head: Normocephalic and atraumatic.  Right Ear: External ear normal.  Left Ear: External ear normal.  Nose: Nose normal.  Mouth/Throat: Oropharynx is clear and moist.  Eyes: Conjunctivae and EOM are normal. Pupils are equal, round,  and reactive to light. Right eye exhibits no discharge. Left eye exhibits no discharge. No scleral icterus.  Neck: Normal range of motion. Neck supple. No tracheal deviation present. No thyromegaly present.  Cardiovascular: Normal rate, regular rhythm, normal heart sounds and intact distal pulses.  Exam reveals no gallop and no friction rub.   No murmur heard. Respiratory: Effort normal and breath sounds normal. No respiratory distress. She has no wheezes. She has no rales. She exhibits no tenderness.  GI: Soft. Bowel sounds are normal. She exhibits no distension and no mass. There is  tenderness. There is no rebound and no guarding.  Genitourinary:       Vulva is normal without lesions Vagina is pink moist without discharge Cervix per colpo note Uterus is normal size, contour, position, consistency, mobility, non-tender Adnexa is negative with normal sized ovaries by sonogram  Musculoskeletal: Normal range of motion. She exhibits no edema and no tenderness.  Neurological: She is alert and oriented to person, place, and time. She has normal reflexes. She displays normal reflexes. No cranial nerve deficit. She exhibits normal muscle tone. Coordination normal.  Skin: Skin is warm and dry. No rash noted. No erythema. No pallor.  Psychiatric: She has a normal mood and affect. Her behavior is normal. Judgment and thought content normal.    Labs: Results for orders placed or performed during the hospital encounter of 04/04/18 (from the past 336 hour(s))  CBC   Collection Time: 04/04/18  8:13 AM  Result Value Ref Range   WBC 13.2 (H) 4.0 - 10.5 K/uL   RBC 4.63 3.87 - 5.11 MIL/uL   Hemoglobin 14.0 12.0 - 15.0 g/dL   HCT 41.9 36.0 - 46.0 %   MCV 90.5 78.0 - 100.0 fL   MCH 30.2 26.0 - 34.0 pg   MCHC 33.4 30.0 - 36.0 g/dL   RDW 12.0 11.5 - 15.5 %   Platelets 183 150 - 400 K/uL  Comprehensive metabolic panel   Collection Time: 04/04/18  8:13 AM  Result Value Ref Range   Sodium 135 135 - 145 mmol/L   Potassium 3.3 (L) 3.5 - 5.1 mmol/L   Chloride 99 (L) 101 - 111 mmol/L   CO2 28 22 - 32 mmol/L   Glucose, Bld 120 (H) 65 - 99 mg/dL   BUN 17 6 - 20 mg/dL   Creatinine, Ser 0.66 0.44 - 1.00 mg/dL   Calcium 8.8 (L) 8.9 - 10.3 mg/dL   Total Protein 7.3 6.5 - 8.1 g/dL   Albumin 3.9 3.5 - 5.0 g/dL   AST 16 15 - 41 U/L   ALT 30 14 - 54 U/L   Alkaline Phosphatase 73 38 - 126 U/L   Total Bilirubin 0.6 0.3 - 1.2 mg/dL   GFR calc non Af Amer >60 >60 mL/min   GFR calc Af Amer >60 >60 mL/min   Anion gap 8 5 - 15  hCG, quantitative, pregnancy   Collection Time: 04/04/18  8:13 AM   Result Value Ref Range   hCG, Beta Chain, Quant, S <1 <5 mIU/mL  Rapid HIV screen (HIV 1/2 Ab+Ag)   Collection Time: 04/04/18  8:13 AM  Result Value Ref Range   HIV-1 P24 Antigen - HIV24 NON REACTIVE NON REACTIVE   HIV 1/2 Antibodies NON REACTIVE NON REACTIVE   Interpretation (HIV Ag Ab)      A non reactive test result means that HIV 1 or HIV 2 antibodies and HIV 1 p24 antigen were not detected in the specimen.  Urinalysis, Routine w reflex microscopic   Collection Time: 04/04/18  8:13 AM  Result Value Ref Range   Color, Urine YELLOW YELLOW   APPearance CLEAR CLEAR   Specific Gravity, Urine 1.020 1.005 - 1.030   pH 5.0 5.0 - 8.0   Glucose, UA NEGATIVE NEGATIVE mg/dL   Hgb urine dipstick SMALL (A) NEGATIVE   Bilirubin Urine NEGATIVE NEGATIVE   Ketones, ur NEGATIVE NEGATIVE mg/dL   Protein, ur NEGATIVE NEGATIVE mg/dL   Nitrite NEGATIVE NEGATIVE   Leukocytes, UA NEGATIVE NEGATIVE   RBC / HPF 0-5 0 - 5 RBC/hpf   WBC, UA 0-5 0 - 5 WBC/hpf   Bacteria, UA NONE SEEN NONE SEEN   Squamous Epithelial / LPF 0-5 0 - 5   Mucus PRESENT     EKG: Orders placed or performed during the hospital encounter of 10/26/13  . ED EKG  . ED EKG  . ED EKG  . ED EKG  . EKG 12-Lead  . EKG 12-Lead  . EKG    Imaging Studies: No results found.    Assessment: High grade cervical dysplasia on cervical biopsy, adequate colposcopy  Plan: Laser ablation of the cervix  Florian Buff 04/06/2018 9:22 AM

## 2018-04-06 NOTE — Anesthesia Procedure Notes (Signed)
Procedure Name: LMA Insertion Date/Time: 04/06/2018 10:14 AM Performed by: Adams, Amy A, CRNA Pre-anesthesia Checklist: Patient identified, Timeout performed, Emergency Drugs available, Suction available and Patient being monitored Patient Re-evaluated:Patient Re-evaluated prior to induction Oxygen Delivery Method: Circle system utilized Preoxygenation: Pre-oxygenation with 100% oxygen Induction Type: IV induction Ventilation: Mask ventilation without difficulty LMA Size: 4.0 Number of attempts: 1 Placement Confirmation: positive ETCO2 and breath sounds checked- equal and bilateral Tube secured with: Tape Dental Injury: Teeth and Oropharynx as per pre-operative assessment

## 2018-04-06 NOTE — Transfer of Care (Signed)
Immediate Anesthesia Transfer of Care Note  Patient: Cynthia Hood  Procedure(s) Performed: LASER ABLATION OF THE CERVIX (N/A )  Patient Location: PACU  Anesthesia Type:General  Level of Consciousness: awake, alert , oriented and patient cooperative  Airway & Oxygen Therapy: Patient Spontanous Breathing  Post-op Assessment: Report given to RN and Post -op Vital signs reviewed and stable  Post vital signs: Reviewed and stable  Last Vitals:  Vitals Value Taken Time  BP    Temp    Pulse    Resp    SpO2      Last Pain:  Vitals:   04/06/18 0852  TempSrc: Oral  PainSc: 0-No pain         Complications: No apparent anesthesia complications

## 2018-04-06 NOTE — Addendum Note (Signed)
Addendum  created 04/06/18 1058 by Mickel Baas, CRNA   Intraprocedure Meds edited

## 2018-04-06 NOTE — Anesthesia Preprocedure Evaluation (Signed)
Anesthesia Evaluation  Patient identified by MRN, date of birth, ID band Patient awake    Reviewed: Allergy & Precautions, H&P , NPO status , Patient's Chart, lab work & pertinent test results, reviewed documented beta blocker date and time   Airway Mallampati: II  TM Distance: >3 FB Neck ROM: full    Dental no notable dental hx.    Pulmonary neg pulmonary ROS, Current Smoker,    Pulmonary exam normal breath sounds clear to auscultation       Cardiovascular Exercise Tolerance: Good negative cardio ROS   Rhythm:regular Rate:Normal     Neuro/Psych PSYCHIATRIC DISORDERS Anxiety negative neurological ROS  negative psych ROS   GI/Hepatic negative GI ROS, Neg liver ROS, PUD,   Endo/Other  negative endocrine ROS  Renal/GU negative Renal ROS  negative genitourinary   Musculoskeletal   Abdominal   Peds  Hematology negative hematology ROS (+)   Anesthesia Other Findings   Reproductive/Obstetrics negative OB ROS                             Anesthesia Physical Anesthesia Plan  ASA: II  Anesthesia Plan: General   Post-op Pain Management:    Induction:   PONV Risk Score and Plan:   Airway Management Planned:   Additional Equipment:   Intra-op Plan:   Post-operative Plan:   Informed Consent: I have reviewed the patients History and Physical, chart, labs and discussed the procedure including the risks, benefits and alternatives for the proposed anesthesia with the patient or authorized representative who has indicated his/her understanding and acceptance.   Dental Advisory Given  Plan Discussed with: CRNA  Anesthesia Plan Comments:         Anesthesia Quick Evaluation

## 2018-04-07 ENCOUNTER — Encounter (HOSPITAL_COMMUNITY): Payer: Self-pay | Admitting: Obstetrics & Gynecology

## 2018-04-08 ENCOUNTER — Ambulatory Visit: Payer: 59 | Admitting: Nurse Practitioner

## 2018-04-08 ENCOUNTER — Encounter: Payer: Self-pay | Admitting: Nurse Practitioner

## 2018-04-08 VITALS — BP 101/65 | HR 54 | Temp 97.9°F | Ht 62.0 in | Wt 162.0 lb

## 2018-04-08 DIAGNOSIS — H1033 Unspecified acute conjunctivitis, bilateral: Secondary | ICD-10-CM | POA: Diagnosis not present

## 2018-04-08 MED ORDER — POLYMYXIN B-TRIMETHOPRIM 10000-0.1 UNIT/ML-% OP SOLN: 2.0000 [drp] | OPHTHALMIC | 0 refills | Status: DC

## 2018-04-08 NOTE — Progress Notes (Signed)
   Subjective:    Patient ID: Cynthia Hood, female    DOB: 1986-10-29, 32 y.o.   MRN: [id number]   Chief Complaint: Eyes red and painful   HPI Patient comes in today c/o having slight cough and congestion for 2 weeks. But her main complaint is eyes being matted together this morning with slight pain in both eyes.    Review of Systems  Constitutional: Negative.   HENT: Positive for congestion, rhinorrhea and sneezing. Negative for ear pain, sore throat and trouble swallowing.   Eyes: Positive for discharge, redness and itching.  Respiratory: Negative for cough.   Cardiovascular: Negative.   Genitourinary: Negative.   Neurological: Negative.   Psychiatric/Behavioral: Negative.   All other systems reviewed and are negative.      Objective:   Physical Exam  Constitutional: She is oriented to person, place, and time. She appears well-developed and well-nourished.  HENT:  Right Ear: External ear normal.  Left Ear: External ear normal.  Mouth/Throat: Oropharynx is clear and moist.  Eyes: Pupils are equal, round, and reactive to light. EOM are normal. Right eye exhibits discharge. Left eye exhibits discharge.  Mild conjunctival erythema  Neck: Normal range of motion. Neck supple.  Cardiovascular: Normal rate.  Pulmonary/Chest: Effort normal.  Neurological: She is alert and oriented to person, place, and time.  Skin: Skin is warm.   BP 101/65   Pulse (!) 54   Temp 97.9 F (36.6 C) (Oral)   Ht 5\' 2"  (1.575 m)   Wt 162 lb (73.5 kg)   LMP 03/23/2018   BMI 29.63 kg/m         Assessment & Plan:  [name] in today with chief complaint of Eyes red and painful   1. Acute bacterial conjunctivitis of both eyes Meds ordered this encounter  Medications  . trimethoprim-polymyxin b (POLYTRIM) ophthalmic solution    Sig: Place 2 drops into both eyes every 4 (four) hours.    Dispense:  10 mL    Refill:  0    Order Specific Question:   Supervising Provider    Answer:    Eustaquio Maize [[id number]]   Cool compresses Good handwashing Avoid rubbing eyes RTO prn  Mary-Margaret Hassell Done, FNP

## 2018-04-08 NOTE — Patient Instructions (Signed)

## 2018-04-14 ENCOUNTER — Encounter: Payer: Self-pay | Admitting: Obstetrics & Gynecology

## 2018-04-14 ENCOUNTER — Ambulatory Visit: Payer: 59 | Admitting: Obstetrics & Gynecology

## 2018-04-14 ENCOUNTER — Other Ambulatory Visit: Payer: Self-pay

## 2018-04-14 VITALS — BP 108/64 | HR 97 | Ht 62.0 in | Wt 157.0 lb

## 2018-04-14 DIAGNOSIS — Z9889 Other specified postprocedural states: Secondary | ICD-10-CM

## 2018-04-14 NOTE — Progress Notes (Signed)
  HPI: Patient returns for routine postoperative follow-up having undergone laser ablation of the cervix5/15/2019 .  The patient's immediate postoperative recovery has been unremarkable. Since hospital discharge the patient reports normal post op course.   Current Outpatient Medications: acetaminophen (TYLENOL) 500 MG tablet, Take 1,000 mg by mouth every 6 (six) hours as needed (for pain.)., Disp: , Rfl:  cetirizine (ZYRTEC) 10 MG tablet, Take 10 mg by mouth daily as needed for allergies., Disp: , Rfl:  ketorolac (TORADOL) 10 MG tablet, Take 1 tablet (10 mg total) by mouth every 8 (eight) hours as needed., Disp: 15 tablet, Rfl: 0 trimethoprim-polymyxin b (POLYTRIM) ophthalmic solution, Place 2 drops into both eyes every 4 (four) hours., Disp: 10 mL, Rfl: 0 valACYclovir (VALTREX) 1000 MG tablet, Take 0.5 tablets (500 mg total) by mouth 2 (two) times daily., Disp: 10 tablet, Rfl: 11 HYDROcodone-acetaminophen (NORCO/VICODIN) 5-325 MG tablet, Take 1 tablet by mouth every 6 (six) hours as needed. (Patient not taking: Reported on 04/14/2018), Disp: 10 tablet, Rfl: 0 Norgestimate-Ethinyl Estradiol Triphasic (TRI-SPRINTEC) 0.18/0.215/0.25 MG-35 MCG tablet, Take 1 tablet by mouth daily. (Patient not taking: Reported on 04/14/2018), Disp: 84 tablet, Rfl: 3 ondansetron (ZOFRAN ODT) 8 MG disintegrating tablet, Take 1 tablet (8 mg total) by mouth every 8 (eight) hours as needed for nausea or vomiting. (Patient not taking: Reported on 04/14/2018), Disp: 20 tablet, Rfl: 0  No current facility-administered medications for this visit.     Blood pressure 108/64, pulse 97, height 5\' 2"  (1.575 m), weight 157 lb (71.2 kg), last menstrual period 04/11/2018.  Physical Exam: Cervical cone bed is healing well No evidence of infection No blood from the cervix is in the vault  Diagnostic Tests:   Pathology: HSIL  Impression: S/p laser ablation for HSIL  Plan: No sex for 4 weeks  Follow up: 6  months follow  up Pap  Florian Buff, MD

## 2018-04-22 DIAGNOSIS — Z029 Encounter for administrative examinations, unspecified: Secondary | ICD-10-CM

## 2018-04-28 ENCOUNTER — Encounter (HOSPITAL_COMMUNITY): Payer: Self-pay | Admitting: *Deleted

## 2018-04-28 ENCOUNTER — Emergency Department (HOSPITAL_COMMUNITY)
Admission: EM | Admit: 2018-04-28 | Discharge: 2018-04-29 | Disposition: A | Discharge status: Home / Self Care | Payer: 59 | Attending: Emergency Medicine | Admitting: Emergency Medicine

## 2018-04-28 ENCOUNTER — Other Ambulatory Visit: Payer: Self-pay

## 2018-04-28 DIAGNOSIS — Z79899 Other long term (current) drug therapy: Secondary | ICD-10-CM | POA: Insufficient documentation

## 2018-04-28 DIAGNOSIS — N939 Abnormal uterine and vaginal bleeding, unspecified: Secondary | ICD-10-CM | POA: Diagnosis not present

## 2018-04-28 DIAGNOSIS — F419 Anxiety disorder, unspecified: Secondary | ICD-10-CM | POA: Insufficient documentation

## 2018-04-28 DIAGNOSIS — F4323 Adjustment disorder with mixed anxiety and depressed mood: Secondary | ICD-10-CM | POA: Diagnosis not present

## 2018-04-28 DIAGNOSIS — F17210 Nicotine dependence, cigarettes, uncomplicated: Secondary | ICD-10-CM | POA: Insufficient documentation

## 2018-04-28 NOTE — ED Triage Notes (Signed)
Pt states that she had an ablation on Apr 06 2018, had stopped bleeding until this afternoon when she started having vaginal bleeding again with vaginal pain.

## 2018-04-29 ENCOUNTER — Ambulatory Visit: Payer: 59 | Admitting: Obstetrics and Gynecology

## 2018-04-29 ENCOUNTER — Encounter: Payer: Self-pay | Admitting: Obstetrics and Gynecology

## 2018-04-29 ENCOUNTER — Telehealth: Payer: Self-pay | Admitting: *Deleted

## 2018-04-29 VITALS — BP 123/73 | HR 87 | Wt 156.4 lb

## 2018-04-29 DIAGNOSIS — N888 Other specified noninflammatory disorders of cervix uteri: Secondary | ICD-10-CM

## 2018-04-29 MED ORDER — KETOROLAC TROMETHAMINE 60 MG/2ML IM SOLN
30.0000 mg | Freq: Once | INTRAMUSCULAR | Status: DC
  Filled 2018-04-29: qty 2

## 2018-04-29 NOTE — ED Provider Notes (Signed)
[facility] EMERGENCY DEPARTMENT Provider Note   CSN: [id number] Arrival date & time: 04/28/18  2220  Time seen 23:53 PM    History   Chief Complaint Chief Complaint  Patient presents with  . Vaginal Bleeding    HPI Cynthia BADDERS is a 32 y.o. female.  HPI patient is G5, P1 AB 4, status post laser cervical ablation on May 15 for abnormal Pap.  She states she had been out of work for 3 weeks and went back to work on June 3 and then again today.  She states after she got off work she went to the bathroom and there was blood in her underwear and when she wiped there was bright red blood and there was blood in the toilet.  She initially told me she had no pain at all today.  However when I pointed out she was complaining of pain to triage nurse she said oh yes she was having vaginal pain.  She states it was not cramping.  She denies feeling dizzy or lightheaded.  She states her last normal period started May 23, although when I look at her GYN notes her last period was May 1.  The diagnosis before surgery was high-grade squamous intra-epithelial lesion.  PCP Eustaquio Maize, MD GYN Dr Elonda Husky  Past Medical History:  Diagnosis Date  . Anxiety   . Colitis, ulcerative ([place])   . Ectopic pregnancy     Patient Active Problem List   Diagnosis Date Noted  . Adjustment disorder with mixed anxiety and depressed mood 06/24/2016  . Anxiety 08/14/2014  . Neck pain on left side 04/06/2014  . Left-sided thoracic back pain 04/06/2014  . Umbilical cord, single artery and vein 03/09/2014  . Supervision of low-risk pregnancy 02/09/2014  . Round ligament pain 02/09/2014  . Supervision of other normal pregnancy 12/18/2013  . 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 CERVICAL ABLATION N/A 04/06/2018   Procedure: LASER ABLATION OF THE CERVIX;  Surgeon: Florian Buff, MD;  Location: [facility];  Service: Gynecology;   Laterality: N/A;  . NO PAST SURGERIES       OB History    Gravida  5   Para  1   Term  1   Preterm      AB  4   Living  1     SAB  4   TAB      Ectopic      Multiple      Live Births  1            Home Medications    Prior to Admission medications   Medication Sig Start Date End Date Taking? Authorizing Provider  acetaminophen (TYLENOL) 500 MG tablet Take 1,000 mg by mouth every 6 (six) hours as needed (for pain.).    [provider]  cetirizine (ZYRTEC) 10 MG tablet Take 10 mg by mouth daily as needed for allergies.    [provider]  HYDROcodone-acetaminophen (NORCO/VICODIN) 5-325 MG tablet Take 1 tablet by mouth every 6 (six) hours as needed. Patient not taking: Reported on 04/14/2018 04/06/18   Florian Buff, MD  ketorolac (TORADOL) 10 MG tablet Take 1 tablet (10 mg total) by mouth every 8 (eight) hours as needed. 04/06/18   Florian Buff, MD  Norgestimate-Ethinyl Estradiol Triphasic (TRI-SPRINTEC) 0.18/0.215/0.25 MG-35 MCG tablet Take 1 tablet by mouth daily. Patient not taking: Reported  on 04/14/2018 01/18/18   Florian Buff, MD  ondansetron (ZOFRAN ODT) 8 MG disintegrating tablet Take 1 tablet (8 mg total) by mouth every 8 (eight) hours as needed for nausea or vomiting. Patient not taking: Reported on 04/14/2018 04/06/18   Florian Buff, MD  trimethoprim-polymyxin b Mayra Neer) ophthalmic solution Place 2 drops into both eyes every 4 (four) hours. 04/08/18   Hassell Done, Mary-Margaret, FNP  valACYclovir (VALTREX) 1000 MG tablet Take 0.5 tablets (500 mg total) by mouth 2 (two) times daily. 12/14/16   Florian Buff, MD    Family History Family History  Problem Relation Age of Onset  . Diabetes Father   . Hypertension Father   . Heart disease Father   . Cancer Maternal Grandmother        colon    Social History Social History   Tobacco Use  . Smoking status: Current Some Day Smoker    Packs/day: 0.50    Years: 12.00    Pack years: 6.00      Types: Cigarettes  . Smokeless tobacco: Never Used  Substance Use Topics  . Alcohol use: Yes    Comment: occ  . Drug use: No  employed   Allergies   Patient has no known allergies.   Review of Systems Review of Systems  All other systems reviewed and are negative.    Physical Exam Updated Vital Signs BP 126/78 (BP Location: Right Arm)   Pulse 99   Temp 98.5 F (36.9 C) (Oral)   Resp 18   Ht 5\' 2"  (1.575 m)   Wt 71.2 kg (157 lb)   LMP 04/11/2018   SpO2 99%   BMI 28.72 kg/m   Vital signs normal    Physical Exam  Constitutional: She is oriented to person, place, and time. She appears well-developed and well-nourished. No distress.  HENT:  Head: Normocephalic and atraumatic.  Right Ear: External ear normal.  Left Ear: External ear normal.  Nose: Nose normal.  Eyes: Conjunctivae and EOM are normal.  Neck: Normal range of motion.  Cardiovascular: Normal rate.  Pulmonary/Chest: Effort normal. No respiratory distress.  Abdominal: Soft. Bowel sounds are normal. She exhibits no distension. There is no tenderness. There is no guarding.  Genitourinary:  Genitourinary Comments: Patient has normal external genitalia.  When I examine her vaginal vault there is 1 small/moderate sized dark red blood clot about 2-3 cm in size sitting over the cx which was removed.  When I observe her cervix there is only a very slow amount of dark red blood seen welling up from the os however it is extremely small, and does not run out of the os.  On bimanual exam her uterus and ovaries are nontender to palpation.  I can feel the area where she had her surgery because it feels rougher.  Musculoskeletal: Normal range of motion. She exhibits no edema or deformity.  Neurological: She is alert and oriented to person, place, and time. No cranial nerve deficit.  Skin: Skin is warm and dry. No pallor.  Psychiatric: She has a normal mood and affect. Her behavior is normal. Thought content normal.   Nursing note and vitals reviewed.    ED Treatments / Results  Labs (all labs ordered are listed, but only abnormal results are displayed) Labs Reviewed - No data to display  EKG None  Radiology No results found.  Procedures Procedures (including critical care time)  Medications Ordered in ED Medications  ketorolac (TORADOL) injection 30 mg (30 mg Intramuscular  Refused 04/29/18 0027)     Initial Impression / Assessment and Plan / ED Course  I have reviewed the triage vital signs and the nursing notes.  Pertinent labs & imaging results that were available during my care of the patient were reviewed by me and considered in my medical decision making (see chart for details).   Toradol IM was ordered and patient refused. Pt's panties were on the chair next to her and she is wearing panty liners, states she has had to change twice.   12:36 Dr Glo Herring, used silver nitrate to area if bleeding site seen or can see in office at 8:30 AM   Patient had dark red blood on exam, not bright red blood.  She is wearing panty liners and states she is bleeding heavier than usual.  Patient seems stable and I feel like she can be seen in the office.  Final Clinical Impressions(s) / ED Diagnoses   Final diagnoses:  Vaginal bleeding    ED Discharge Orders    None      Plan discharge  Rolland Porter, MD, Barbette Or, MD 04/29/18 [phone]

## 2018-04-29 NOTE — Discharge Instructions (Addendum)
You can take your Toradol pills if he still has some at home.  Otherwise you can take ibuprofen 600 mg 4 times a day.  [facility] to be seen.  Dr. Glo Herring said they will evaluate you in the morning.

## 2018-04-29 NOTE — Progress Notes (Addendum)
Patient ID: Cynthia Hood, female   DOB: 1986-06-21, 32 y.o.   MRN: [id number] .   Subjective:  Cynthia Hood is a 32 y.o. female now 3 weeks status post Laser ablation of the cervix.   Cynthia Hood went to the emergency room for vaginal bleeding after having ablation surgery.  Review of Systems Negative except Vaginal bleeding   Diet:   normal   Bowel movements : normal.  The patient is not having any pain.  Objective:  BP 123/73 (BP Location: Right Arm, Patient Position: Sitting, Cuff Size: Normal)   Pulse 87   Wt 156 lb 6.4 oz (70.9 kg)   LMP 04/11/2018   BMI 28.61 kg/m  General:Well developed, well nourished.  No acute distress. Abdomen: Bowel sounds normal, soft, non-tender. Pelvic Exam:    External Genitalia:  Normal.    Vagina: Normal    Cervix: small area at site of prior laser 10' clock treated with silver nitrate,     Uterus: Normal    Adnexa/Bimanual: Normal  Incision(s):   Healing well, no drainage, no erythema, no hernia, no swelling, no dehiscence     Assessment:  Post-Op 3 weeks s/p Laser ablation of the cervix  Cervical bleeding, from the healing cervix, treated with silver nitrate Otherwise doing well postoperatively .   Plan:  1.avoid sexual activity x2 weeks 2. . current medications. 3. Activity restrictions: none 4. return to work: not applicable. 5. Follow up prn or as scheduled 6 months  By signing my name below, I, Samul Dada, attest that this documentation has been prepared under the direction and in the presence of Jonnie Kind, MD. Electronically Signed: Maurice. 04/29/18. 11:58 AM.  I personally performed the services described in this documentation, which was SCRIBED in my presence. The recorded information has been reviewed and considered accurate. It has been edited as necessary during review. Jonnie Kind, MD

## 2018-04-29 NOTE — Telephone Encounter (Signed)
Pt called in this morning and said that she was supposed to be worked in today per Dr. Glo Herring at 8:30.  Pt missed appt so discussed with nurse and Dr. Glo Herring said to work pt in at whatever time we could.  Pt was given appt at 10:45.  Pt voiced understanding.  04-29-18  AS

## 2018-05-09 ENCOUNTER — Ambulatory Visit: Payer: 59 | Admitting: Obstetrics and Gynecology

## 2018-05-12 ENCOUNTER — Telehealth: Payer: Self-pay | Admitting: Pediatrics

## 2018-05-12 DIAGNOSIS — Z720 Tobacco use: Secondary | ICD-10-CM

## 2018-05-12 NOTE — Telephone Encounter (Signed)
Pharmacy [facility] [place]   PT was orginally prescribed chantix but insurance would not approve it until she tried wellbutrin, pt states that insurance will now cover chantix and wants to know if dr Evette Doffing will send that to the pharmacy

## 2018-05-13 MED ORDER — VARENICLINE TARTRATE 0.5 MG X 11 & 1 MG X 42 PO MISC: ORAL | 0 refills | Status: DC

## 2018-05-13 NOTE — Telephone Encounter (Signed)
Sent in starting month

## 2018-05-24 NOTE — Telephone Encounter (Signed)
NA, NVM 

## 2018-05-24 NOTE — Telephone Encounter (Signed)
Multiple attempts made to contact patient.  This encounter will now be closed  

## 2018-10-07 ENCOUNTER — Telehealth: Payer: Self-pay | Admitting: Pediatrics

## 2018-10-07 DIAGNOSIS — Z720 Tobacco use: Secondary | ICD-10-CM

## 2018-10-07 MED ORDER — VARENICLINE TARTRATE 1 MG PO TABS: 1.0000 mg | ORAL_TABLET | Freq: Two times a day (BID) | ORAL | 1 refills | Status: DC

## 2018-10-07 NOTE — Telephone Encounter (Signed)
Pt has called there was 2 starter packs called into [facility] for the stop smoking packs, however pt states that she needs the continues pack sent to [facility], that she has only about 4 days left on the starter pack

## 2018-10-07 NOTE — Telephone Encounter (Signed)
Patient aware.

## 2019-03-17 ENCOUNTER — Telehealth: Payer: Self-pay | Admitting: Obstetrics & Gynecology

## 2019-03-17 NOTE — Telephone Encounter (Signed)
Patient called, stated that she recently changed sex partners and thinks she has a VD.  She wants to see if she can get an antibiotic called in or if she needs to be seen.  [facility] [place]  [phone]

## 2019-03-20 ENCOUNTER — Other Ambulatory Visit: Payer: Self-pay

## 2019-03-20 ENCOUNTER — Other Ambulatory Visit: Payer: 59

## 2019-03-20 DIAGNOSIS — N898 Other specified noninflammatory disorders of vagina: Secondary | ICD-10-CM | POA: Diagnosis not present

## 2019-03-24 LAB — NUSWAB VAGINITIS PLUS (VG+)
BVAB 2: HIGH Score — AB
Candida albicans, NAA: NEGATIVE
Candida glabrata, NAA: NEGATIVE
Chlamydia trachomatis, NAA: NEGATIVE
Megasphaera 1: HIGH Score — AB
Neisseria gonorrhoeae, NAA: NEGATIVE
Trich vag by NAA: NEGATIVE

## 2019-03-27 ENCOUNTER — Telehealth: Payer: Self-pay | Admitting: Obstetrics & Gynecology

## 2019-03-27 MED ORDER — METRONIDAZOLE 500 MG PO TABS: 500.0000 mg | ORAL_TABLET | Freq: Two times a day (BID) | ORAL | 0 refills | Status: DC

## 2019-03-27 NOTE — Telephone Encounter (Signed)
Pt requesting a call with her lab results from last week.

## 2019-03-27 NOTE — Telephone Encounter (Signed)
Informed patient of +BV. Rx to pharmacy.

## 2019-04-19 ENCOUNTER — Other Ambulatory Visit: Payer: Self-pay | Admitting: Obstetrics & Gynecology

## 2019-04-19 MED ORDER — NORGESTIM-ETH ESTRAD TRIPHASIC 0.18/0.215/0.25 MG-35 MCG PO TABS: 1.0000 | ORAL_TABLET | Freq: Every day | ORAL | 3 refills | Status: DC

## 2019-04-19 NOTE — Telephone Encounter (Signed)
Pt requesting a refill of her birth control.

## 2019-05-05 ENCOUNTER — Other Ambulatory Visit: Payer: 59 | Admitting: Obstetrics & Gynecology

## 2019-05-16 ENCOUNTER — Other Ambulatory Visit: Payer: 59 | Admitting: Adult Health

## 2019-08-14 DIAGNOSIS — Z0289 Encounter for other administrative examinations: Secondary | ICD-10-CM

## 2019-09-14 ENCOUNTER — Emergency Department (HOSPITAL_COMMUNITY)
Admission: EM | Admit: 2019-09-14 | Discharge: 2019-09-14 | Disposition: A | Discharge status: Home / Self Care | Payer: 59 | Attending: Emergency Medicine | Admitting: Emergency Medicine

## 2019-09-14 ENCOUNTER — Encounter (HOSPITAL_COMMUNITY): Payer: Self-pay | Admitting: Emergency Medicine

## 2019-09-14 ENCOUNTER — Other Ambulatory Visit: Payer: Self-pay

## 2019-09-14 DIAGNOSIS — E860 Dehydration: Secondary | ICD-10-CM | POA: Diagnosis not present

## 2019-09-14 DIAGNOSIS — F17210 Nicotine dependence, cigarettes, uncomplicated: Secondary | ICD-10-CM | POA: Diagnosis not present

## 2019-09-14 DIAGNOSIS — R197 Diarrhea, unspecified: Secondary | ICD-10-CM | POA: Insufficient documentation

## 2019-09-14 DIAGNOSIS — Z79899 Other long term (current) drug therapy: Secondary | ICD-10-CM | POA: Insufficient documentation

## 2019-09-14 DIAGNOSIS — Z793 Long term (current) use of hormonal contraceptives: Secondary | ICD-10-CM | POA: Insufficient documentation

## 2019-09-14 DIAGNOSIS — R112 Nausea with vomiting, unspecified: Secondary | ICD-10-CM | POA: Insufficient documentation

## 2019-09-14 LAB — URINALYSIS, ROUTINE W REFLEX MICROSCOPIC
Bilirubin Urine: NEGATIVE
Glucose, UA: NEGATIVE mg/dL
Hgb urine dipstick: NEGATIVE
Ketones, ur: 20 mg/dL — AB
Leukocytes,Ua: NEGATIVE
Nitrite: NEGATIVE
Protein, ur: NEGATIVE mg/dL
Specific Gravity, Urine: 1.025 (ref 1.005–1.030)
pH: 5 (ref 5.0–8.0)

## 2019-09-14 LAB — CBC
HCT: 41.9 % (ref 36.0–46.0)
Hemoglobin: 13.9 g/dL (ref 12.0–15.0)
MCH: 29.4 pg (ref 26.0–34.0)
MCHC: 33.2 g/dL (ref 30.0–36.0)
MCV: 88.8 fL (ref 80.0–100.0)
Platelets: 206 10*3/uL (ref 150–400)
RBC: 4.72 MIL/uL (ref 3.87–5.11)
RDW: 11.6 % (ref 11.5–15.5)
WBC: 15.2 10*3/uL — ABNORMAL HIGH (ref 4.0–10.5)
nRBC: 0 % (ref 0.0–0.2)

## 2019-09-14 LAB — COMPREHENSIVE METABOLIC PANEL
ALT: 47 U/L — ABNORMAL HIGH (ref 0–44)
AST: 22 U/L (ref 15–41)
Albumin: 3.9 g/dL (ref 3.5–5.0)
Alkaline Phosphatase: 57 U/L (ref 38–126)
Anion gap: 10 (ref 5–15)
BUN: 24 mg/dL — ABNORMAL HIGH (ref 6–20)
CO2: 25 mmol/L (ref 22–32)
Calcium: 9.1 mg/dL (ref 8.9–10.3)
Chloride: 102 mmol/L (ref 98–111)
Creatinine, Ser: 0.72 mg/dL (ref 0.44–1.00)
GFR calc Af Amer: >60 mL/min (ref >60)
GFR calc non Af Amer: >60 mL/min (ref >60)
Glucose, Bld: 118 mg/dL — ABNORMAL HIGH (ref 70–99)
Potassium: 4 mmol/L (ref 3.5–5.1)
Sodium: 137 mmol/L (ref 135–145)
Total Bilirubin: 0.6 mg/dL (ref 0.3–1.2)
Total Protein: 6.9 g/dL (ref 6.5–8.1)

## 2019-09-14 LAB — LIPASE, BLOOD: Lipase: 22 U/L (ref 11–51)

## 2019-09-14 LAB — POC URINE PREG, ED: Preg Test, Ur: NEGATIVE

## 2019-09-14 MED ORDER — SODIUM CHLORIDE 0.9 % IV BOLUS
1000.0000 mL | Freq: Once | INTRAVENOUS | Status: AC
  Administered 2019-09-14: 1000 mL via INTRAVENOUS

## 2019-09-14 MED ORDER — ONDANSETRON HCL 4 MG PO TABS: 4.0000 mg | ORAL_TABLET | Freq: Three times a day (TID) | ORAL | 0 refills | Status: DC | PRN

## 2019-09-14 MED ORDER — METOCLOPRAMIDE HCL 5 MG/ML IJ SOLN
10.0000 mg | Freq: Once | INTRAMUSCULAR | Status: AC
  Administered 2019-09-14: 10 mg via INTRAVENOUS
  Filled 2019-09-14: qty 2

## 2019-09-14 MED ORDER — DIPHENHYDRAMINE HCL 50 MG/ML IJ SOLN
25.0000 mg | Freq: Once | INTRAMUSCULAR | Status: AC
  Administered 2019-09-14: 08:00:00 25 mg via INTRAVENOUS
  Filled 2019-09-14: qty 1

## 2019-09-14 NOTE — Discharge Instructions (Addendum)
Drink plenty of fluids (clear liquids) then start a bland diet later this morning such as toast, crackers, jello, [employer] chicken noodle soup. Use the zofran for nausea or vomiting. Take imodium OTC for diarrhea. Avoid milk products until the diarrhea is gone. ° °

## 2019-09-14 NOTE — ED Triage Notes (Signed)
Pt states that she ate tuna earlier today and then began vomiting about 2 hours after. Pt also c/o of diarrhea and stomach cramping.

## 2019-09-14 NOTE — ED Provider Notes (Signed)
Patient CARE signed out to follow-up clinical improvement.  Patient improved on reassessment.  IV fluids and oral fluids given.  Patient clinically has gastroenteritis with vomiting and diarrhea no significant pain on reassessment.  Urinalysis no signs of infection, pregnancy negative.  Patient stable for outpatient follow-up.  Golda Acre, MD 09/14/19 [phone]

## 2019-09-14 NOTE — ED Provider Notes (Signed)
[facility] EMERGENCY DEPARTMENT Provider Note   CSN: [id number] Arrival date & time: 09/14/19  0202   Time seen 5:10 AM  History   Chief Complaint Chief Complaint  Patient presents with  . Emesis    HPI Cynthia Hood is a 33 y.o. female.     HPI patient states she ate some tuna in a pouch that she had bought recently.  She states about an hour later she started having abdominal bloating and cramping and then about 9 PM started having vomiting and diarrhea.  She has vomited 3 times and had about 12 episodes of watery diarrhea.  She complains of upper abdominal pain.  She states she has never had this before or any other type of food intolerance.  There is no family history of gallstones.  She has not been on antibiotics recently.  PCP Dettinger, Fransisca Kaufmann, MD   Past Medical History:  Diagnosis Date  . Anxiety   . Colitis, ulcerative ([place])   . Ectopic pregnancy   . Vaginal Pap smear, abnormal     Patient Active Problem List   Diagnosis Date Noted  . Adjustment disorder with mixed anxiety and depressed mood 06/24/2016  . Anxiety 08/14/2014  . Neck pain on left side 04/06/2014  . Left-sided thoracic back pain 04/06/2014  . Umbilical cord, single artery and vein 03/09/2014  . Supervision of low-risk pregnancy 02/09/2014  . Round ligament pain 02/09/2014  . Supervision of other normal pregnancy 12/18/2013  . 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 CERVICAL ABLATION N/A 04/06/2018   Procedure: LASER ABLATION OF THE CERVIX;  Surgeon: Florian Buff, MD;  Location: [facility];  Service: Gynecology;  Laterality: N/A;  . NO PAST SURGERIES       OB History    Gravida  5   Para  1   Term  1   Preterm      AB  4   Living  1     SAB  4   TAB      Ectopic      Multiple      Live Births  1            Home Medications    Prior to Admission medications    Medication Sig Start Date End Date Taking? Authorizing Provider  acetaminophen (TYLENOL) 500 MG tablet Take 1,000 mg by mouth every 6 (six) hours as needed (for pain.).    [provider]  cetirizine (ZYRTEC) 10 MG tablet Take 10 mg by mouth daily as needed for allergies.    [provider]  HYDROcodone-acetaminophen (NORCO/VICODIN) 5-325 MG tablet Take 1 tablet by mouth every 6 (six) hours as needed. Patient not taking: Reported on 04/14/2018 04/06/18   Florian Buff, MD  ketorolac (TORADOL) 10 MG tablet Take 1 tablet (10 mg total) by mouth every 8 (eight) hours as needed. 04/06/18   Florian Buff, MD  metroNIDAZOLE (FLAGYL) 500 MG tablet Take 1 tablet (500 mg total) by mouth 2 (two) times daily. 03/27/19   Chancy Milroy, MD  Norgestimate-Ethinyl Estradiol Triphasic (TRI-SPRINTEC) 0.18/0.215/0.25 MG-35 MCG tablet Take 1 tablet by mouth daily. 04/19/19   Florian Buff, MD  ondansetron (ZOFRAN ODT) 8 MG disintegrating tablet Take 1 tablet (8 mg total) by mouth every 8 (eight) hours as needed for nausea or vomiting. Patient not taking: Reported on 04/29/2018 04/06/18  Florian Buff, MD  trimethoprim-polymyxin b (POLYTRIM) ophthalmic solution Place 2 drops into both eyes every 4 (four) hours. Patient not taking: Reported on 04/29/2018 04/08/18   Chevis Pretty, FNP  valACYclovir (VALTREX) 1000 MG tablet Take 0.5 tablets (500 mg total) by mouth 2 (two) times daily. Patient not taking: Reported on 04/29/2018 12/14/16   Florian Buff, MD  varenicline (CHANTIX CONTINUING MONTH PAK) 1 MG tablet Take 1 tablet (1 mg total) by mouth 2 (two) times daily. 10/07/18   Eustaquio Maize, MD    Family History Family History  Problem Relation Age of Onset  . Diabetes Father   . Hypertension Father   . Heart disease Father   . Cancer Maternal Grandmother        colon    Social History Social History   Tobacco Use  . Smoking status: Current Some Day Smoker    Packs/day: 0.50    Years:  12.00    Pack years: 6.00    Types: Cigarettes  . Smokeless tobacco: Never Used  Substance Use Topics  . Alcohol use: Yes    Comment: occ  . Drug use: No  employed   Allergies   Patient has no known allergies.   Review of Systems Review of Systems  All other systems reviewed and are negative.    Physical Exam Updated Vital Signs BP 120/80 (BP Location: Right Arm)   Pulse (!) 106   Temp 98.2 F (36.8 C) (Oral)   Resp 16   Ht 5\' 2"  (1.575 m)   Wt 74.8 kg   LMP 08/27/2019   SpO2 98%   BMI 30.18 kg/m   Physical Exam Vitals signs and nursing note reviewed.  Constitutional:      General: She is not in acute distress.    Appearance: Normal appearance. She is well-developed. She is not ill-appearing or toxic-appearing.  HENT:     Head: Normocephalic and atraumatic.     Right Ear: External ear normal.     Left Ear: External ear normal.     Nose: Nose normal. No mucosal edema or rhinorrhea.     Mouth/Throat:     Mouth: Mucous membranes are dry.     Dentition: No dental abscesses.     Pharynx: Oropharynx is clear. No oropharyngeal exudate, posterior oropharyngeal erythema or uvula swelling.  Eyes:     Extraocular Movements: Extraocular movements intact.     Conjunctiva/sclera: Conjunctivae normal.     Pupils: Pupils are equal, round, and reactive to light.  Neck:     Musculoskeletal: Full passive range of motion without pain, normal range of motion and neck supple.  Cardiovascular:     Rate and Rhythm: Normal rate and regular rhythm.     Heart sounds: Normal heart sounds. No murmur. No friction rub. No gallop.   Pulmonary:     Effort: Pulmonary effort is normal. No respiratory distress.     Breath sounds: Normal breath sounds. No wheezing, rhonchi or rales.  Chest:     Chest wall: No tenderness or crepitus.  Abdominal:     General: Bowel sounds are normal. There is no distension.     Palpations: Abdomen is soft.     Tenderness: There is no abdominal tenderness.  There is no guarding or rebound.  Musculoskeletal: Normal range of motion.        General: No tenderness.     Comments: Moves all extremities well.   Skin:    General: Skin is warm and  dry.     Coloration: Skin is not pale.     Findings: No erythema or rash.  Neurological:     General: No focal deficit present.     Mental Status: She is alert and oriented to person, place, and time.     Cranial Nerves: No cranial nerve deficit.  Psychiatric:        Mood and Affect: Mood normal. Mood is not anxious.        Speech: Speech normal.        Behavior: Behavior normal.        Thought Content: Thought content normal.      ED Treatments / Results  Labs (all labs ordered are listed, but only abnormal results are displayed) Results for orders placed or performed during the hospital encounter of 09/14/19  Lipase, blood  Result Value Ref Range   Lipase 22 11 - 51 U/L  Comprehensive metabolic panel  Result Value Ref Range   Sodium 137 135 - 145 mmol/L   Potassium 4.0 3.5 - 5.1 mmol/L   Chloride 102 98 - 111 mmol/L   CO2 25 22 - 32 mmol/L   Glucose, Bld 118 (H) 70 - 99 mg/dL   BUN 24 (H) 6 - 20 mg/dL   Creatinine, Ser 0.72 0.44 - 1.00 mg/dL   Calcium 9.1 8.9 - 10.3 mg/dL   Total Protein 6.9 6.5 - 8.1 g/dL   Albumin 3.9 3.5 - 5.0 g/dL   AST 22 15 - 41 U/L   ALT 47 (H) 0 - 44 U/L   Alkaline Phosphatase 57 38 - 126 U/L   Total Bilirubin 0.6 0.3 - 1.2 mg/dL   GFR calc non Af Amer >60 >60 mL/min   GFR calc Af Amer >60 >60 mL/min   Anion gap 10 5 - 15  CBC  Result Value Ref Range   WBC 15.2 (H) 4.0 - 10.5 K/uL   RBC 4.72 3.87 - 5.11 MIL/uL   Hemoglobin 13.9 12.0 - 15.0 g/dL   HCT 41.9 36.0 - 46.0 %   MCV 88.8 80.0 - 100.0 fL   MCH 29.4 26.0 - 34.0 pg   MCHC 33.2 30.0 - 36.0 g/dL   RDW 11.6 11.5 - 15.5 %   Platelets 206 150 - 400 K/uL   nRBC 0.0 0.0 - 0.2 %   Laboratory interpretation all normal except leukocytosis consistent with vomiting, mildly elevated BUN consistent with  dehydration    EKG None  Radiology No results found.  Procedures Procedures (including critical care time)  Medications Ordered in ED Medications  metoCLOPramide (REGLAN) injection 10 mg (has no administration in time range)  diphenhydrAMINE (BENADRYL) injection 25 mg (has no administration in time range)  sodium chloride 0.9 % bolus 1,000 mL (1,000 mLs Intravenous New Bag/Given 09/14/19 0634)  sodium chloride 0.9 % bolus 1,000 mL (1,000 mLs Intravenous New Bag/Given 09/14/19 [id number])     Initial Impression / Assessment and Plan / ED Course  I have reviewed the triage vital signs and the nursing notes.  Pertinent labs & imaging results that were available during my care of the patient were reviewed by me and considered in my medical decision making (see chart for details).       Patient was given IV fluids and IV nausea medication.  Recheck at 6:30 AM she is just getting her treatment started due to a emergency we had in the ED.  Patient was turned over at change of shift at 7 AM to Dr.  Zavitz.  I suspect she will do well with IV fluids.  Final Clinical Impressions(s) / ED Diagnoses   Final diagnoses:  Nausea vomiting and diarrhea  Dehydration    Disposition pending  Rolland Porter, MD, Barbette Or, MD 09/14/19 [phone]

## 2019-11-10 ENCOUNTER — Other Ambulatory Visit: Payer: 59

## 2019-11-13 ENCOUNTER — Encounter: Payer: Self-pay | Admitting: Family

## 2019-11-13 ENCOUNTER — Ambulatory Visit (INDEPENDENT_AMBULATORY_CARE_PROVIDER_SITE_OTHER): Payer: 59 | Admitting: Family

## 2019-11-13 DIAGNOSIS — U071 COVID-19: Secondary | ICD-10-CM

## 2019-11-13 NOTE — Progress Notes (Signed)
   Virtual Visit via telephone Note Due to COVID-19 pandemic this visit was conducted virtually. This visit type was conducted due to national recommendations for restrictions regarding the COVID-19 Pandemic (e.g. social distancing, sheltering in place) in an effort to limit this patient's exposure and mitigate transmission in our community. All issues noted in this document were discussed and addressed.  A physical exam was not performed with this format.  I connected with Cynthia Hood on 11/13/19 at 11:00 AM by telephone and verified that I am speaking with the correct person using two identifiers. Cynthia Hood is currently located at home and no one is currently with her during visit. The provider, Evelina Dun, FNP is located in their office at time of visit.  I discussed the limitations, risks, security and privacy concerns of performing an evaluation and management service by telephone and the availability of in person appointments. I also discussed with the patient that there may be a patient responsible charge related to this service. The patient expressed understanding and agreed to proceed.   History and Present Illness:  HPI  Pt calls the office today with cough, dizziness, loss of taste and smell, and headaches. She was diagnosed with COVID on 10/30/19. She has been quarantined for the last 14 days.  She reports her symptoms are improving, but does not feel like she could back tonight. She has been taking Mucinex, tylenol, and OTC decongestant.  She has been out of work since 10/30/19.  Review of Systems  HENT: Positive for congestion.   Respiratory: Positive for cough.   All other systems reviewed and are negative.    Observations/Objective: No SOB or distress noted   Assessment and Plan: 1. COVID-19 virus detected Contiue OTC medications Will extend work note til 11/16/19 (10/30/19-11/13/19) related to COVID, weakness, and dizziness Force fluids Call office if  symptoms worsen or do not improve      I discussed the assessment and treatment plan with the patient. The patient was provided an opportunity to ask questions and all were answered. The patient agreed with the plan and demonstrated an understanding of the instructions.   The patient was advised to call back or seek an in-person evaluation if the symptoms worsen or if the condition fails to improve as anticipated.  The above assessment and management plan was discussed with the patient. The patient verbalized understanding of and has agreed to the management plan. Patient is aware to call the clinic if symptoms persist or worsen. Patient is aware when to return to the clinic for a follow-up visit. Patient educated on when it is appropriate to go to the emergency department.   Time call ended:  11:12 AM  I provided 12 minutes of non-face-to-face time during this encounter.    Evelina Dun, FNP '

## 2020-01-19 ENCOUNTER — Encounter (HOSPITAL_COMMUNITY): Payer: Self-pay | Admitting: Emergency Medicine

## 2020-01-19 ENCOUNTER — Emergency Department (HOSPITAL_COMMUNITY)
Admission: EM | Admit: 2020-01-19 | Discharge: 2020-01-19 | Disposition: A | Discharge status: Home / Self Care | Payer: No Typology Code available for payment source | Attending: Emergency Medicine | Admitting: Emergency Medicine

## 2020-01-19 ENCOUNTER — Emergency Department (HOSPITAL_COMMUNITY): Payer: No Typology Code available for payment source

## 2020-01-19 ENCOUNTER — Other Ambulatory Visit: Payer: Self-pay

## 2020-01-19 DIAGNOSIS — W230XXA Caught, crushed, jammed, or pinched between moving objects, initial encounter: Secondary | ICD-10-CM | POA: Diagnosis not present

## 2020-01-19 DIAGNOSIS — S61309A Unspecified open wound of unspecified finger with damage to nail, initial encounter: Secondary | ICD-10-CM | POA: Diagnosis present

## 2020-01-19 DIAGNOSIS — Z720 Tobacco use: Secondary | ICD-10-CM | POA: Insufficient documentation

## 2020-01-19 DIAGNOSIS — Y9289 Other specified places as the place of occurrence of the external cause: Secondary | ICD-10-CM | POA: Insufficient documentation

## 2020-01-19 DIAGNOSIS — S61319A Laceration without foreign body of unspecified finger with damage to nail, initial encounter: Secondary | ICD-10-CM

## 2020-01-19 DIAGNOSIS — S62632B Displaced fracture of distal phalanx of right middle finger, initial encounter for open fracture: Secondary | ICD-10-CM | POA: Diagnosis not present

## 2020-01-19 DIAGNOSIS — Z23 Encounter for immunization: Secondary | ICD-10-CM | POA: Diagnosis not present

## 2020-01-19 DIAGNOSIS — Z8616 Personal history of COVID-19: Secondary | ICD-10-CM | POA: Insufficient documentation

## 2020-01-19 DIAGNOSIS — T1490XA Injury, unspecified, initial encounter: Secondary | ICD-10-CM

## 2020-01-19 DIAGNOSIS — Y939 Activity, unspecified: Secondary | ICD-10-CM | POA: Diagnosis not present

## 2020-01-19 DIAGNOSIS — Y990 Civilian activity done for income or pay: Secondary | ICD-10-CM | POA: Diagnosis not present

## 2020-01-19 HISTORY — DX: COVID-19: U07.1

## 2020-01-19 MED ORDER — CEPHALEXIN 500 MG PO CAPS: 500.0000 mg | ORAL_CAPSULE | Freq: Four times a day (QID) | ORAL | 0 refills | Status: DC

## 2020-01-19 MED ORDER — CEFAZOLIN SODIUM-DEXTROSE 1-4 GM/50ML-% IV SOLN
1.0000 g | Freq: Once | INTRAVENOUS | Status: AC
  Administered 2020-01-19: 14:00:00 1 g via INTRAVENOUS
  Filled 2020-01-19: qty 50

## 2020-01-19 MED ORDER — LIDOCAINE HCL (PF) 1 % IJ SOLN
10.0000 mL | Freq: Once | INTRAMUSCULAR | Status: AC
  Administered 2020-01-19: 13:00:00 10 mL
  Filled 2020-01-19: qty 10

## 2020-01-19 MED ORDER — HYDROCODONE-ACETAMINOPHEN 5-325 MG PO TABS: 1.0000 | ORAL_TABLET | Freq: Four times a day (QID) | ORAL | 0 refills | Status: DC | PRN

## 2020-01-19 MED ORDER — TETANUS-DIPHTH-ACELL PERTUSSIS 5-2.5-18.5 LF-MCG/0.5 IM SUSP
0.5000 mL | Freq: Once | INTRAMUSCULAR | Status: AC
  Administered 2020-01-19: 11:00:00 0.5 mL via INTRAMUSCULAR
  Filled 2020-01-19: qty 0.5

## 2020-01-19 MED ORDER — LIDOCAINE HCL (PF) 1 % IJ SOLN
10.0000 mL | Freq: Once | INTRAMUSCULAR | Status: AC
  Administered 2020-01-19: 11:00:00 10 mL
  Filled 2020-01-19: qty 10

## 2020-01-19 NOTE — ED Notes (Signed)
Patient verbalizes understanding of discharge instructions . Opportunity for questions and answers were provided . Armband removed by staff ,Pt discharged from ED. W/C  offered at D/C  and Declined W/C at D/C and was escorted to lobby by RN.  

## 2020-01-19 NOTE — Discharge Instructions (Signed)
Call Dr. Brennan Bailey office today to schedule follow up, remain in splints until you are seen by hand surgery, keep hand clean and dry.  Take Keflex 4 times daily to help prevent infection.  Take Norco as needed for pain medicine, you can take ibuprofen 600 mg every 6 hours in addition to this.

## 2020-01-19 NOTE — Consult Note (Signed)
Reason for Consult:Hand injury Referring Physician: Aileen Pilot  Cynthia Hood is an 34 y.o. female.  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 She had injuries to the index and long fingers and came to the ED for evaluation. X-rays showed a long finger tuft fx and nail bed injuries and hand surgery was consulted. She is RHD.  Past Medical History:  Diagnosis Date  . Anxiety   . Colitis, ulcerative ([place])   . COVID-19 virus infection   . Ectopic pregnancy   . Vaginal Pap smear, abnormal     Past Surgical History:  Procedure Laterality Date  . CERVICAL ABLATION N/A 04/06/2018   Procedure: LASER ABLATION OF THE CERVIX;  Surgeon: Florian Buff, MD;  Location: [facility];  Service: Gynecology;  Laterality: N/A;  . NO PAST SURGERIES      Family History  Problem Relation Age of Onset  . Diabetes Father   . Hypertension Father   . Heart disease Father   . Cancer Maternal Grandmother        colon    Social History:  reports that she has been smoking cigarettes. She has a 6.00 pack-year smoking history. She has never used smokeless tobacco. She reports current alcohol use. She reports that she does not use drugs.  Allergies: No Known Allergies  Medications: I have reviewed the patient's current medications.  No results found for this or any previous visit (from the past 48 hour(s)).  DG Hand Complete Right  Result Date: 01/19/2020 CLINICAL DATA:  Injury with kitchen roller EXAM: RIGHT HAND - COMPLETE 3+ VIEW COMPARISON:  None. FINDINGS: Frontal, oblique, and lateral views were obtained. There is a comminuted fracture of the distal aspect of the third distal phalanx with mild displacement of fracture fragments in this area. No other fractures are evident. No dislocation. Joint spaces appear normal. No erosive change. IMPRESSION: Comminuted fracture distal aspect third distal phalanx with mild displacement of fracture fragments in this area. No other  fractures. No dislocation. No appreciable arthropathy. Electronically Signed   By: Lowella Grip III M.D.   On: 01/19/2020 11:04    Review of Systems  HENT: Negative for ear discharge, ear pain, hearing loss and tinnitus.   Eyes: Negative for photophobia and pain.  Respiratory: Negative for cough and shortness of breath.   Cardiovascular: Negative for chest pain.  Gastrointestinal: Negative for abdominal pain, nausea and vomiting.  Genitourinary: Negative for dysuria, flank pain, frequency and urgency.  Musculoskeletal: Positive for arthralgias (Right index/long fingers). Negative for back pain, myalgias and neck pain.  Neurological: Negative for dizziness and headaches.  Hematological: Does not bruise/bleed easily.  Psychiatric/Behavioral: The patient is not nervous/anxious.    Blood pressure 122/61, pulse 98, temperature 99.1 F (37.3 C), temperature source Oral, resp. rate 16, last menstrual period 01/08/2020, SpO2 98 %. Physical Exam  Constitutional: She appears well-developed and well-nourished. No distress.  HENT:  Head: Normocephalic and atraumatic.  Eyes: Conjunctivae are normal. Right eye exhibits no discharge. Left eye exhibits no discharge. No scleral icterus.  Cardiovascular: Normal rate and regular rhythm.  Respiratory: Effort normal. No respiratory distress.  Musculoskeletal:     Cervical back: Normal range of motion.     Comments: Right shoulder, elbow, wrist, digits- Nail avulsions index/long fingers, nail bed lacerations both, mod TTP, no instability, no blocks to motion  Sens  Ax/R/M/U intact  Mot   Ax/ R/ PIN/ M/ AIN/ U intact  Rad  2+  Neurological: She is alert.  Skin: Skin is warm and dry. She is not diaphoretic.  Psychiatric: She has a normal mood and affect. Her behavior is normal.    Assessment/Plan: Right hand injury -- EDPA to repair nail beds, dress, and splint. F/u with Dr. Lenon Curt next week.    Lisette Abu, PA-C Orthopedic  Surgery [phone] 01/19/2020, 12:53 PM

## 2020-01-19 NOTE — ED Triage Notes (Signed)
Right index and middle fingers were caught in a roller at work, states fingernails are missing. Splinted from work.

## 2020-01-19 NOTE — ED Provider Notes (Signed)
[facility] EMERGENCY DEPARTMENT Provider Note   CSN: [id number] Arrival date & time: 01/19/20  1029     History Chief Complaint  Patient presents with  . Finger Injury    Cynthia Hood is a 34 y.o. female.  Cynthia Hood is a 34 y.o. female with history of previous Covid infection, ulcerative colitis, and anxiety, who presents to the ED from work for evaluation of injury to her right index and middle fingers.  She states that she works as a Engineer, civil (consulting) and her fingers got pulled up between 2 metal rollers when she tried to pull her finger out and ripped off both of her fingernails.  She reports a moderate amount of bleeding.  She states that the middle finger especially is very painful over the fingertip and she is concerned that her finger is broken.  She denies any numbness or tingling.  She is able to move the fingers.  She is not on any blood thinners.  Is unsure when her last tetanus vaccination was.  The history is provided by the patient.       Past Medical History:  Diagnosis Date  . Anxiety   . Colitis, ulcerative ([place])   . COVID-19 virus infection   . Ectopic pregnancy   . Vaginal Pap smear, abnormal     Patient Active Problem List   Diagnosis Date Noted  . Adjustment disorder with mixed anxiety and depressed mood 06/24/2016  . Anxiety 08/14/2014  . Neck pain on left side 04/06/2014  . Left-sided thoracic back pain 04/06/2014  . Umbilical cord, single artery and vein 03/09/2014  . Supervision of low-risk pregnancy 02/09/2014  . Round ligament pain 02/09/2014  . Supervision of other normal pregnancy 12/18/2013  . 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 CERVICAL ABLATION N/A 04/06/2018   Procedure: LASER ABLATION OF THE CERVIX;  Surgeon: Florian Buff, MD;  Location: [facility];  Service: Gynecology;  Laterality: N/A;  . NO PAST SURGERIES        OB History    Gravida  5   Para  1   Term  1   Preterm      AB  4   Living  1     SAB  4   TAB      Ectopic      Multiple      Live Births  1           Family History  Problem Relation Age of Onset  . Diabetes Father   . Hypertension Father   . Heart disease Father   . Cancer Maternal Grandmother        colon    Social History   Tobacco Use  . Smoking status: Current Some Day Smoker    Packs/day: 0.50    Years: 12.00    Pack years: 6.00    Types: Cigarettes  . Smokeless tobacco: Never Used  Substance Use Topics  . Alcohol use: Yes    Comment: occ  . Drug use: No    Home Medications Prior to Admission medications   Medication Sig Start Date End Date Taking? Authorizing Provider  acetaminophen (TYLENOL) 500 MG tablet Take 1,000 mg by mouth every 6 (six) hours as needed (for pain.).    [provider]  cephALEXin (KEFLEX) 500 MG capsule Take 1 capsule (500 mg total) by mouth 4 (four)  times daily. 01/19/20   Jacqlyn Larsen, PA-C  HYDROcodone-acetaminophen (NORCO) 5-325 MG tablet Take 1 tablet by mouth every 6 (six) hours as needed. 01/19/20   Jacqlyn Larsen, PA-C  Norgestimate-Ethinyl Estradiol Triphasic (TRI-SPRINTEC) 0.18/0.215/0.25 MG-35 MCG tablet Take 1 tablet by mouth daily. 04/19/19   Florian Buff, MD    Allergies    Patient has no known allergies.  Review of Systems   Review of Systems  Constitutional: Negative for chills and fever.  Musculoskeletal: Positive for arthralgias. Negative for joint swelling.  Skin: Positive for wound.  Neurological: Negative for weakness and numbness.  All other systems reviewed and are negative.   Physical Exam Updated Vital Signs BP 122/61 (BP Location: Right Arm)   Pulse 98   Temp 99.1 F (37.3 C) (Oral)   Resp 16   LMP 01/08/2020 (Exact Date)   SpO2 98%   Physical Exam Vitals and nursing note reviewed.  Constitutional:      General: She is not in acute distress.    Appearance:  Normal appearance. She is well-developed and normal weight. She is not ill-appearing or diaphoretic.  HENT:     Head: Normocephalic and atraumatic.  Eyes:     General:        Right eye: No discharge.        Left eye: No discharge.  Pulmonary:     Effort: Pulmonary effort is normal. No respiratory distress.  Musculoskeletal:     Comments: Right second and third finger with nails completely avulsed, there appears to be a laceration across the middle of the nailbed of the third finger with oozing bleeding.  Patient is able to flex and extend all fingers with some discomfort.  Sensation is intact.  (See photo below)  Skin:    General: Skin is warm and dry.  Neurological:     Mental Status: She is alert and oriented to person, place, and time.     Coordination: Coordination normal.  Psychiatric:        Mood and Affect: Mood normal.        Behavior: Behavior normal.         ED Results / Procedures / Treatments   Labs (all labs ordered are listed, but only abnormal results are displayed) Labs Reviewed - No data to display  EKG None  Radiology DG Hand Complete Right  Result Date: 01/19/2020 CLINICAL DATA:  Injury with kitchen roller EXAM: RIGHT HAND - COMPLETE 3+ VIEW COMPARISON:  None. FINDINGS: Frontal, oblique, and lateral views were obtained. There is a comminuted fracture of the distal aspect of the third distal phalanx with mild displacement of fracture fragments in this area. No other fractures are evident. No dislocation. Joint spaces appear normal. No erosive change. IMPRESSION: Comminuted fracture distal aspect third distal phalanx with mild displacement of fracture fragments in this area. No other fractures. No dislocation. No appreciable arthropathy. Electronically Signed   By: Lowella Grip III M.D.   On: 01/19/2020 11:04    Procedures .Marland KitchenLaceration Repair  Date/Time: 01/19/2020 2:17 PM Performed by: Jacqlyn Larsen, PA-C Authorized by: Jacqlyn Larsen, PA-C    Consent:    Consent obtained:  Verbal   Consent given by:  Patient   Risks discussed:  Infection, pain, poor cosmetic result, poor wound healing, nerve damage and need for additional repair   Alternatives discussed:  No treatment Anesthesia (see MAR for exact dosages):    Anesthesia method:  Nerve block   Block needle gauge:  25 G   Block anesthetic:  Lidocaine 1% w/o epi   Block injection procedure:  Anatomic landmarks identified and introduced needle Laceration details:    Location:  Finger   Finger location:  R long finger (Nailbed laceration)   Length (cm):  2   Depth (mm):  3 Repair type:    Repair type:  Simple Exploration:    Hemostasis achieved with:  Direct pressure and tourniquet   Wound exploration: wound explored through full range of motion and entire depth of wound probed and visualized     Wound extent: underlying fracture     Wound extent comment:  Nailbed Treatment:    Area cleansed with:  Saline   Amount of cleaning:  Extensive   Irrigation solution:  Sterile saline   Irrigation method:  Syringe Skin repair:    Repair method:  Sutures   Suture size:  5-0   Suture material:  Chromic gut   Suture technique:  Simple interrupted   Number of sutures:  5 Approximation:    Approximation:  Close Post-procedure details:    Dressing:  Non-adherent dressing and splint for protection   Patient tolerance of procedure:  Tolerated well, no immediate complications   (including critical care time)  Medications Ordered in ED Medications  lidocaine (PF) (XYLOCAINE) 1 % injection 10 mL (10 mLs Infiltration Given 01/19/20 1117)  Tdap (BOOSTRIX) injection 0.5 mL (0.5 mLs Intramuscular Given 01/19/20 1117)  ceFAZolin (ANCEF) IVPB 1 g/50 mL premix (0 g Intravenous Stopped 01/19/20 1448)  lidocaine (PF) (XYLOCAINE) 1 % injection 10 mL (10 mLs Infiltration Given 01/19/20 1314)    ED Course  I have reviewed the triage vital signs and the nursing notes.  Pertinent labs &  imaging results that were available during my care of the patient were reviewed by me and considered in my medical decision making (see chart for details).    MDM Rules/Calculators/A&P                      34 year old female presents with nail avulsions of the second and third finger on the right hand, there is also a nailbed laceration of the third finger with associated comminuted distal phalanx fracture noted on x-ray, no other fractures noted within the hand.  Fingers are neurovascularly intact.  Will discuss with hand surgery.  Case discussed with Orion Crook with orthopedics, he consulted with Dr. Lenon Curt on call for hand surgery, recommends repair of nailbed laceration, IV Ancef here in the ED and home with Keflex, finger splints, he will see the patient in follow-up.  Nailbed laceration repaired.  Attempted to place Adaptic into the nail hole to help hold it open and then placed a nonadherent gauze dressing over the finger, both fingers were placed in splints for protection.  Patient to follow-up with Dr. Lenon Curt.  She was given 1 dose of IV Ancef and prescribed Keflex for infection prophylaxis, Norco for pain in addition to ibuprofen.  Strict return precautions discussed.  Patient expresses understanding and agreement with plan.  Discharged home in good condition.  Final Clinical Impression(s) / ED Diagnoses Final diagnoses:  Avulsion of fingernail, initial encounter  Laceration of nail bed of finger, initial encounter  Open displaced fracture of distal phalanx of right middle finger, initial encounter    Rx / DC Orders ED Discharge Orders         Ordered    cephALEXin (KEFLEX) 500 MG capsule  4 times daily  01/19/20 1433    HYDROcodone-acetaminophen (NORCO) 5-325 MG tablet  Every 6 hours PRN     01/19/20 1433           Janet Berlin 01/19/20 1512    Isla Pence, MD 01/25/20 1156

## 2020-05-14 ENCOUNTER — Other Ambulatory Visit: Payer: Self-pay

## 2020-05-14 ENCOUNTER — Encounter: Payer: Self-pay | Admitting: Obstetrics & Gynecology

## 2020-05-14 ENCOUNTER — Ambulatory Visit: Payer: 59 | Admitting: Obstetrics & Gynecology

## 2020-05-14 ENCOUNTER — Other Ambulatory Visit (HOSPITAL_COMMUNITY)
Admission: RE | Admit: 2020-05-14 | Discharge: 2020-05-14 | Disposition: A | Discharge status: Home / Self Care | Payer: 59 | Source: Ambulatory Visit | Attending: Obstetrics & Gynecology | Admitting: Obstetrics & Gynecology

## 2020-05-14 VITALS — BP 116/82 | HR 105 | Ht 62.0 in | Wt 159.0 lb

## 2020-05-14 DIAGNOSIS — R87613 High grade squamous intraepithelial lesion on cytologic smear of cervix (HGSIL): Secondary | ICD-10-CM

## 2020-05-14 DIAGNOSIS — N938 Other specified abnormal uterine and vaginal bleeding: Secondary | ICD-10-CM | POA: Diagnosis not present

## 2020-05-14 MED ORDER — MEGESTROL ACETATE 40 MG PO TABS: ORAL_TABLET | ORAL | 0 refills | Status: DC

## 2020-05-14 MED ORDER — NORGESTIMATE-ETH ESTRADIOL 0.25-35 MG-MCG PO TABS: 1.0000 | ORAL_TABLET | Freq: Every day | ORAL | 12 refills | Status: DC

## 2020-05-14 MED ORDER — VALACYCLOVIR HCL 1 G PO TABS: 1000.0000 mg | ORAL_TABLET | Freq: Two times a day (BID) | ORAL | 11 refills | Status: DC

## 2020-05-14 NOTE — Progress Notes (Signed)
Chief Complaint  Patient presents with  . abnormal spotting following miscarriage in May    needs pap      34 y.o. Cynthia Hood Patient's last menstrual period was 04/23/2020 (approximate). The current method of family planning is OCP (estrogen/progesterone).  Outpatient Encounter Medications as of 05/14/2020  Medication Sig  . acetaminophen (TYLENOL) 500 MG tablet Take 1,000 mg by mouth every 6 (six) hours as needed (for pain.).  Marland Kitchen Norgestimate-Ethinyl Estradiol Triphasic (TRI-SPRINTEC) 0.18/0.215/0.25 MG-35 MCG tablet Take 1 tablet by mouth daily.  . megestrol (MEGACE) 40 MG tablet 3 tablets a day for 5 days, 2 tablets a day for 5 days then 1 tablet daily  . norgestimate-ethinyl estradiol (ORTHO-CYCLEN) 0.25-35 MG-MCG tablet Take 1 tablet by mouth daily.  . valACYclovir (VALTREX) 1000 MG tablet Take 1,000 mg by mouth 2 (two) times daily.  . [DISCONTINUED] cephALEXin (KEFLEX) 500 MG capsule Take 1 capsule (500 mg total) by mouth 4 (four) times daily.  . [DISCONTINUED] HYDROcodone-acetaminophen (NORCO) 5-325 MG tablet Take 1 tablet by mouth every 6 (six) hours as needed.   No facility-administered encounter medications on file as of 05/14/2020.    Subjective Pt had very early pregnancy loss, chemical pregnancy, in May Stopped bleeding, began pills and has not stopped bleeding since her scheduled menses and has brown spotiing And what sounds like decidualized endometrium passage as well No pain or cramping Past Medical History:  Diagnosis Date  . Anxiety   . Colitis, ulcerative ([place])   . COVID-19 virus infection   . Ectopic pregnancy   . Miscarriage   . Vaginal Pap smear, abnormal     Past Surgical History:  Procedure Laterality Date  . CERVICAL ABLATION N/A 04/06/2018   Procedure: LASER ABLATION OF THE CERVIX;  Surgeon: Florian Buff, MD;  Location: [facility];  Service: Gynecology;  Laterality: N/A;  . NO PAST SURGERIES      OB History    Gravida  6   Para  1   Term    1   Preterm      AB  5   Living  1     SAB  5   TAB      Ectopic      Multiple      Live Births  1           No Known Allergies  Social History   Socioeconomic History  . Marital status: Single    Spouse name: Not on file  . Number of children: Not on file  . Years of education: Not on file  . Highest education level: Not on file  Occupational History  . Not on file  Tobacco Use  . Smoking status: Former Smoker    Packs/day: 0.50    Years: 12.00    Pack years: 6.00    Types: Cigarettes  . Smokeless tobacco: Never Used  Vaping Use  . Vaping Use: Never used  Substance and Sexual Activity  . Alcohol use: Yes    Comment: occ  . Drug use: No  . Sexual activity: Yes    Birth control/protection: Pill  Other Topics Concern  . Not on file  Social History Narrative  . Not on file   Social Determinants of Health   Financial Resource Strain:   . Difficulty of Paying Living Expenses:   Food Insecurity:   . Worried About Charity fundraiser in the Last Year:   . Arboriculturist in  the Last Year:   Transportation Needs:   . Film/video editor (Medical):   Marland Kitchen Lack of Transportation (Non-Medical):   Physical Activity:   . Days of Exercise per Week:   . Minutes of Exercise per Session:   Stress:   . Feeling of Stress :   Social Connections:   . Frequency of Communication with Friends and Family:   . Frequency of Social Gatherings with Friends and Family:   . Attends Religious Services:   . Active Member of Clubs or Organizations:   . Attends Archivist Meetings:   Marland Kitchen Marital Status:     Family History  Problem Relation Age of Onset  . Diabetes Father   . Hypertension Father   . Heart disease Father   . Cancer Maternal Grandmother        colon    Medications:       Current Outpatient Medications:  .  acetaminophen (TYLENOL) 500 MG tablet, Take 1,000 mg by mouth every 6 (six) hours as needed (for pain.)., Disp: , Rfl:  .   Norgestimate-Ethinyl Estradiol Triphasic (TRI-SPRINTEC) 0.18/0.215/0.25 MG-35 MCG tablet, Take 1 tablet by mouth daily., Disp: 84 tablet, Rfl: 3 .  megestrol (MEGACE) 40 MG tablet, 3 tablets a day for 5 days, 2 tablets a day for 5 days then 1 tablet daily, Disp: 45 tablet, Rfl: 0 .  norgestimate-ethinyl estradiol (ORTHO-CYCLEN) 0.25-35 MG-MCG tablet, Take 1 tablet by mouth daily., Disp: 28 tablet, Rfl: 12 .  valACYclovir (VALTREX) 1000 MG tablet, Take 1,000 mg by mouth 2 (two) times daily., Disp: , Rfl:   Objective Blood pressure 116/82, pulse (!) 105, height 5\' 2"  (1.575 m), weight 159 lb (72.1 kg), last menstrual period 04/23/2020.  General WDWN female NAD Vulva:  normal appearing vulva with no masses, tenderness or lesions Vagina:  normal mucosa, no discharge Cervix:  Normal no lesions Uterus:  normal size, contour, position, consistency, mobility, non-tender Adnexa: ovaries:present,  normal adnexa in size, nontender and no masses   Pertinent ROS No burning with urination, frequency or urgency No nausea, vomiting or diarrhea Nor fever chills or other constitutional symptoms   Labs or studies No new    Impression Diagnoses this Encounter::   ICD-10-CM   1. DUB (dysfunctional uterine bleeding)  N93.8   2. High grade squamous intraepithelial cervical dysplasia  R87.613 Cytology - PAP( [place])    Established relevant diagnosis(es):   Plan/Recommendations: Meds ordered this encounter  Medications  . megestrol (MEGACE) 40 MG tablet    Sig: 3 tablets a day for 5 days, 2 tablets a day for 5 days then 1 tablet daily    Dispense:  45 tablet    Refill:  0  . norgestimate-ethinyl estradiol (ORTHO-CYCLEN) 0.25-35 MG-MCG tablet    Sig: Take 1 tablet by mouth daily.    Dispense:  28 tablet    Refill:  12    Labs or Scans Ordered: No orders of the defined types were placed in this encounter.   Management:: Megestrol for endomtrial synchronization then off 1 week then  resume norgestimate based OCP  Follow up Return in about 1 year (around 05/14/2021), or if symptoms worsen or fail to improve, for yearly, with Dr Elonda Husky.      All questions were answered.

## 2020-05-19 LAB — CYTOLOGY - PAP
Comment: NEGATIVE
High risk HPV: NEGATIVE

## 2020-10-22 ENCOUNTER — Encounter: Payer: Self-pay | Admitting: Nurse Practitioner

## 2020-10-22 ENCOUNTER — Ambulatory Visit: Payer: 59 | Admitting: Nurse Practitioner

## 2020-10-22 ENCOUNTER — Other Ambulatory Visit: Payer: Self-pay

## 2020-10-22 VITALS — BP 103/62 | HR 91 | Temp 98.4°F | Resp 20 | Ht 62.0 in | Wt 161.0 lb

## 2020-10-22 DIAGNOSIS — Z716 Tobacco abuse counseling: Secondary | ICD-10-CM

## 2020-10-22 DIAGNOSIS — F419 Anxiety disorder, unspecified: Secondary | ICD-10-CM | POA: Diagnosis not present

## 2020-10-22 MED ORDER — HYDROXYZINE HCL 10 MG PO TABS: 10.0000 mg | ORAL_TABLET | Freq: Three times a day (TID) | ORAL | 0 refills | Status: DC | PRN

## 2020-10-22 MED ORDER — VARENICLINE TARTRATE 0.5 MG PO TABS: 0.5000 mg | ORAL_TABLET | Freq: Two times a day (BID) | ORAL | 0 refills | Status: DC

## 2020-10-22 NOTE — Assessment & Plan Note (Signed)
Patient is in today for smoking cessation counseling.  Patient is willing to try Chantix.  Patient reports having successful effects in the last few years before relapse.  Education provided to patient with printed handouts given.  Rx sent to pharmacy

## 2020-10-22 NOTE — Patient Instructions (Signed)
Generalized Anxiety Disorder, Adult Generalized anxiety disorder (GAD) is a mental health disorder. People with this condition constantly worry about everyday events. Unlike normal anxiety, worry related to GAD is not triggered by a specific event. These worries also do not fade or get better with time. GAD interferes with life functions, including relationships, work, and school. GAD can vary from mild to severe. People with severe GAD can have intense waves of anxiety with physical symptoms (panic attacks). What are the causes? The exact cause of GAD is not known. What increases the risk? This condition is more likely to develop in:  Women.  People who have a family history of anxiety disorders.  People who are very shy.  People who experience very stressful life events, such as the death of a loved one.  People who have a very stressful family environment. What are the signs or symptoms? People with GAD often worry excessively about many things in their lives, such as their health and family. They may also be overly concerned about:  Doing well at work.  Being on time.  Natural disasters.  Friendships. Physical symptoms of GAD include:  Fatigue.  Muscle tension or having muscle twitches.  Trembling or feeling shaky.  Being easily startled.  Feeling like your heart is pounding or racing.  Feeling out of breath or like you cannot take a deep breath.  Having trouble falling asleep or staying asleep.  Sweating.  Nausea, diarrhea, or irritable bowel syndrome (IBS).  Headaches.  Trouble concentrating or remembering facts.  Restlessness.  Irritability. How is this diagnosed? Your health care provider can diagnose GAD based on your symptoms and medical history. You will also have a physical exam. The health care provider will ask specific questions about your symptoms, including how severe they are, when they started, and if they come and go. Your health care  provider may ask you about your use of alcohol or drugs, including prescription medicines. Your health care provider may refer you to a mental health specialist for further evaluation. Your health care provider will do a thorough examination and may perform additional tests to rule out other possible causes of your symptoms. To be diagnosed with GAD, a person must have anxiety that:  Is out of his or her control.  Affects several different aspects of his or her life, such as work and relationships.  Causes distress that makes him or her unable to take part in normal activities.  Includes at least three physical symptoms of GAD, such as restlessness, fatigue, trouble concentrating, irritability, muscle tension, or sleep problems. Before your health care provider can confirm a diagnosis of GAD, these symptoms must be present more days than they are not, and they must last for six months or longer. How is this treated? The following therapies are usually used to treat GAD:  Medicine. Antidepressant medicine is usually prescribed for long-term daily control. Antianxiety medicines may be added in severe cases, especially when panic attacks occur.  Talk therapy (psychotherapy). Certain types of talk therapy can be helpful in treating GAD by providing support, education, and guidance. Options include: ? Cognitive behavioral therapy (CBT). People learn coping skills and techniques to ease their anxiety. They learn to identify unrealistic or negative thoughts and behaviors and to replace them with positive ones. ? Acceptance and commitment therapy (ACT). This treatment teaches people how to be mindful as a way to cope with unwanted thoughts and feelings. ? Biofeedback. This process trains you to manage your body's response (  physiological response) through breathing techniques and relaxation methods. You will work with a therapist while machines are used to monitor your physical symptoms.  Stress  management techniques. These include yoga, meditation, and exercise. A mental health specialist can help determine which treatment is best for you. Some people see improvement with one type of therapy. However, other people require a combination of therapies. Follow these instructions at home:  Take over-the-counter and prescription medicines only as told by your health care provider.  Try to maintain a normal routine.  Try to anticipate stressful situations and allow extra time to manage them.  Practice any stress management or self-calming techniques as taught by your health care provider.  Do not punish yourself for setbacks or for not making progress.  Try to recognize your accomplishments, even if they are small.  Keep all follow-up visits as told by your health care provider. This is important. Contact a health care provider if:  Your symptoms do not get better.  Your symptoms get worse.  You have signs of depression, such as: ? A persistently sad, cranky, or irritable mood. ? Loss of enjoyment in activities that used to bring you joy. ? Change in weight or eating. ? Changes in sleeping habits. ? Avoiding friends or family members. ? Loss of energy for normal tasks. ? Feelings of guilt or worthlessness. Get help right away if:  You have serious thoughts about hurting yourself or others. If you ever feel like you may hurt yourself or others, or have thoughts about taking your own life, get help right away. You can go to your nearest emergency department or call:  Your local emergency services (911 in the [place]  A suicide crisis helpline, such as the [facility] at 743-787-0006. This is open 24 hours a day. Summary  Generalized anxiety disorder (GAD) is a mental health disorder that involves worry that is not triggered by a specific event.  People with GAD often worry excessively about many things in their lives, such as their health and  family.  GAD may cause physical symptoms such as restlessness, trouble concentrating, sleep problems, frequent sweating, nausea, diarrhea, headaches, and trembling or muscle twitching.  A mental health specialist can help determine which treatment is best for you. Some people see improvement with one type of therapy. However, other people require a combination of therapies. This information is not intended to replace advice given to you by your health care provider. Make sure you discuss any questions you have with your health care provider. Document Revised: 10/22/2017 Document Reviewed: 09/29/2016 Elsevier Patient Education  2020 [place] with Quitting Smoking  Quitting smoking is a physical and mental challenge. You will face cravings, withdrawal symptoms, and temptation. Before quitting, work with your health care provider to make a plan that can help you cope. Preparation can help you quit and keep you from giving in. How can I cope with cravings? Cravings usually last for 5-10 minutes. If you get through it, the craving will pass. Consider taking the following actions to help you cope with cravings:  Keep your mouth busy: ? Chew sugar-free gum. ? Suck on hard candies or a straw. ? Brush your teeth.  Keep your hands and body busy: ? Immediately change to a different activity when you feel a craving. ? Squeeze or play with a ball. ? Do an activity or a hobby, like making bead jewelry, practicing needlepoint, or working with wood. ? Mix up your normal routine. ?  Take a short exercise break. Go for a quick walk or run up and down stairs. ? Spend time in public places where smoking is not allowed.  Focus on doing something kind or helpful for someone else.  Call a friend or family member to talk during a craving.  Join a support group.  Call a quit line, such as 1-800-QUIT-NOW.  Talk with your health care provider about medicines that might help you cope with cravings  and make quitting easier for you. How can I deal with withdrawal symptoms? Your body may experience negative effects as it tries to get used to not having nicotine in the system. These effects are called withdrawal symptoms. They may include:  Feeling hungrier than normal.  Trouble concentrating.  Irritability.  Trouble sleeping.  Feeling depressed.  Restlessness and agitation.  Craving a cigarette. To manage withdrawal symptoms:  Avoid places, people, and activities that trigger your cravings.  Remember why you want to quit.  Get plenty of sleep.  Avoid coffee and other caffeinated drinks. These may worsen some of your symptoms. How can I handle social situations? Social situations can be difficult when you are quitting smoking, especially in the first few weeks. To manage this, you can:  Avoid parties, bars, and other social situations where people might be smoking.  Avoid alcohol.  Leave right away if you have the urge to smoke.  Explain to your family and friends that you are quitting smoking. Ask for understanding and support.  Plan activities with friends or family where smoking is not an option. What are some ways I can cope with stress? Wanting to smoke may cause stress, and stress can make you want to smoke. Find ways to manage your stress. Relaxation techniques can help. For example:  Breathe slowly and deeply, in through your nose and out through your mouth.  Listen to soothing, relaxing music.  Talk with a family member or friend about your stress.  Light a candle.  Soak in a bath or take a shower.  Think about a peaceful place. What are some ways I can prevent weight gain? Be aware that many people gain weight after they quit smoking. However, not everyone does. To keep from gaining weight, have a plan in place before you quit and stick to the plan after you quit. Your plan should include:  Having healthy snacks. When you have a craving, it may help  to: ? Eat plain popcorn, crunchy carrots, celery, or other cut vegetables. ? Chew sugar-free gum.  Changing how you eat: ? Eat small portion sizes at meals. ? Eat 4-6 small meals throughout the day instead of 1-2 large meals a day. ? Be mindful when you eat. Do not watch television or do other things that might distract you as you eat.  Exercising regularly: ? Make time to exercise each day. If you do not have time for a long workout, do short bouts of exercise for 5-10 minutes several times a day. ? Do some form of strengthening exercise, like weight lifting, and some form of aerobic exercise, like running or swimming.  Drinking plenty of water or other low-calorie or no-calorie drinks. Drink 6-8 glasses of water daily, or as much as instructed by your health care provider. Summary  Quitting smoking is a physical and mental challenge. You will face cravings, withdrawal symptoms, and temptation to smoke again. Preparation can help you as you go through these challenges.  You can cope with cravings by keeping your mouth  busy (such as by chewing gum), keeping your body and hands busy, and making calls to family, friends, or a helpline for people who want to quit smoking.  You can cope with withdrawal symptoms by avoiding places where people smoke, avoiding drinks with caffeine, and getting plenty of rest.  Ask your health care provider about the different ways to prevent weight gain, avoid stress, and handle social situations. This information is not intended to replace advice given to you by your health care provider. Make sure you discuss any questions you have with your health care provider. Document Revised: 10/22/2017 Document Reviewed: 11/06/2016 Elsevier Patient Education  2020 [employer].

## 2020-10-22 NOTE — Assessment & Plan Note (Signed)
Anxiety not well controlled.  Completed GAD-7 patient scored a 18.  Advised patient she will benefit from an antianxiety medication.  Advised patient that a low-dose BuSpar will be beneficial to control anxiety but patient is not willing to start medication at this time and would prefer to try cognitive behavioral therapy, and an as needed antianxiety medication.  Provided education to patient with printed handouts given.  Rx sent to pharmacy  Follow-up in 4 weeks.

## 2020-10-22 NOTE — Progress Notes (Signed)
Established Patient Office Visit  Subjective:  Patient ID: Cynthia Hood, female    DOB: 08/18/1986  Age: 34 y.o. MRN: [id number]  CC:  Chief Complaint  Patient presents with   Nicotine Dependence    wants to quit    Anxiety    HPI Cynthia Hood is a 34 year old female who presents to clinic for nicotine dependence and wants to quit smoking cigarettes.  Patient reports using Chantix and was able to quit smoking for 2 years prior to having a job-related accident which drove her to increase stress and relapse.  Patient is ready to start back smoking cessation.   Anxiety: Patient complains of anxiety disorder.  She has the following symptoms: difficulty concentrating, feelings of losing control, irritable. Onset of symptoms was approximately 5 years ago, gradually worsening since that time. She denies current suicidal and homicidal ideation. Family history significant for no psychiatric illness.Possible organic causes contributing are: none. Risk factors: previous episode of depression Previous treatment includes Faith in family's talk therapy.  She complains of the following side effects from the treatment: none.   Past Medical History:  Diagnosis Date   Anxiety    Colitis, ulcerative ([place])    COVID-19 virus infection    Ectopic pregnancy    Miscarriage    Vaginal Pap smear, abnormal     Past Surgical History:  Procedure Laterality Date   CERVICAL ABLATION N/A 04/06/2018   Procedure: LASER ABLATION OF THE CERVIX;  Surgeon: Florian Buff, MD;  Location: [facility];  Service: Gynecology;  Laterality: N/A;   NO PAST SURGERIES      Family History  Problem Relation Age of Onset   Diabetes Father    Hypertension Father    Heart disease Father    Cancer Maternal Grandmother        colon    Social History   Socioeconomic History   Marital status: Single    Spouse name: Not on file   Number of children: Not on file   Years of education: Not on file    Highest education level: Not on file  Occupational History   Not on file  Tobacco Use   Smoking status: Former Smoker    Packs/day: 0.50    Years: 12.00    Pack years: 6.00    Types: Cigarettes   Smokeless tobacco: Never Used  Scientific laboratory technician Use: Never used  Substance and Sexual Activity   Alcohol use: Yes    Comment: occ   Drug use: No   Sexual activity: Yes    Birth control/protection: Pill  Other Topics Concern   Not on file  Social History Narrative   Not on file   Social Determinants of Health   Financial Resource Strain:    Difficulty of Paying Living Expenses: Not on file  Food Insecurity:    Worried About Charity fundraiser in the Last Year: Not on file   [employer] of Food in the Last Year: Not on file  Transportation Needs:    Lack of Transportation (Medical): Not on file   Lack of Transportation (Non-Medical): Not on file  Physical Activity:    Days of Exercise per Week: Not on file   Minutes of Exercise per Session: Not on file  Stress:    Feeling of Stress : Not on file  Social Connections:    Frequency of Communication with Friends and Family: Not on file   Frequency of Social Gatherings with Friends  and Family: Not on file   Attends Religious Services: Not on file   Active Member of Clubs or Organizations: Not on file   Attends Archivist Meetings: Not on file   Marital Status: Not on file  Intimate Partner Violence:    Fear of Current or Ex-Partner: Not on file   Emotionally Abused: Not on file   Physically Abused: Not on file   Sexually Abused: Not on file    Outpatient Medications Prior to Visit  Medication Sig Dispense Refill   norgestimate-ethinyl estradiol (ORTHO-CYCLEN) 0.25-35 MG-MCG tablet Take 1 tablet by mouth daily. 28 tablet 12   valACYclovir (VALTREX) 1000 MG tablet Take 1 tablet (1,000 mg total) by mouth 2 (two) times daily. (Patient not taking: Reported on 10/22/2020) 10 tablet 11    acetaminophen (TYLENOL) 500 MG tablet Take 1,000 mg by mouth every 6 (six) hours as needed (for pain.).     megestrol (MEGACE) 40 MG tablet 3 tablets a day for 5 days, 2 tablets a day for 5 days then 1 tablet daily 45 tablet 0   No facility-administered medications prior to visit.    No Known Allergies  ROS Review of Systems    Objective:    Physical Exam  BP 103/62    Pulse 91    Temp 98.4 F (36.9 C)    Resp 20    Ht 5\' 2"  (1.575 m)    Wt 161 lb (73 kg)    LMP 10/22/2020    SpO2 96%    BMI 29.45 kg/m  Wt Readings from Last 3 Encounters:  10/22/20 161 lb (73 kg)  05/14/20 159 lb (72.1 kg)  09/14/19 165 lb (74.8 kg)     Health Maintenance Due  Topic Date Due   Hepatitis C Screening  Never done   COVID-19 Vaccine (1) Never done   INFLUENZA VACCINE  Never done    There are no preventive care reminders to display for this patient.  No results found for: TSH Lab Results  Component Value Date   WBC 15.2 (H) 09/14/2019   HGB 13.9 09/14/2019   HCT 41.9 09/14/2019   MCV 88.8 09/14/2019   PLT 206 09/14/2019   Lab Results  Component Value Date   NA 137 09/14/2019   K 4.0 09/14/2019   CO2 25 09/14/2019   GLUCOSE 118 (H) 09/14/2019   BUN 24 (H) 09/14/2019   CREATININE 0.72 09/14/2019   BILITOT 0.6 09/14/2019   ALKPHOS 57 09/14/2019   AST 22 09/14/2019   ALT 47 (H) 09/14/2019   PROT 6.9 09/14/2019   ALBUMIN 3.9 09/14/2019   CALCIUM 9.1 09/14/2019   ANIONGAP 10 09/14/2019   No results found for: CHOL No results found for: HDL No results found for: LDLCALC No results found for: TRIG No results found for: CHOLHDL No results found for: HGBA1C    Assessment & Plan:   Problem List Items Addressed This Visit    None      No orders of the defined types were placed in this encounter.   Follow-up: No follow-ups on file.    Ivy Lynn, NP

## 2020-10-23 ENCOUNTER — Telehealth: Payer: Self-pay

## 2020-10-24 MED ORDER — CHANTIX STARTING MONTH PAK 0.5 MG X 11 & 1 MG X 42 PO TABS: ORAL_TABLET | ORAL | 0 refills | Status: DC

## 2020-10-24 NOTE — Telephone Encounter (Signed)
Reordered as generic starter pack. Will wait for new PA

## 2020-10-25 ENCOUNTER — Telehealth: Payer: Self-pay

## 2020-10-25 ENCOUNTER — Telehealth: Payer: Self-pay | Admitting: *Deleted

## 2020-10-25 NOTE — Telephone Encounter (Signed)
Vm from pt about PA for her Chantix

## 2020-10-28 NOTE — Telephone Encounter (Signed)
I think Zyban will be great, question is is patient willing to use Zyban for smoking cessation because I remember her insisting on Chantix.  Please follow-up with patient and let me know and I will send Zyban to pharmacy.

## 2020-10-28 NOTE — Telephone Encounter (Signed)
I called [facility] and discussed this:  Patients insurance will not cover chantix or generic (even with a PA)  Insurance request is ZYBAN for smoking cessation.  Please advise IF you think this will be ok.      Note - attempted to let pt know - No VM, and no answer.

## 2020-10-30 ENCOUNTER — Other Ambulatory Visit: Payer: Self-pay | Admitting: Nurse Practitioner

## 2020-10-30 MED ORDER — BUPROPION HCL ER (SMOKING DET) 150 MG PO TB12: 150.0000 mg | ORAL_TABLET | Freq: Two times a day (BID) | ORAL | 1 refills | Status: DC

## 2020-10-30 NOTE — Telephone Encounter (Signed)
Sent Zyban to the pharmacy for 2 months supply .  Follow-up in 2 months to reassess therapy.  Best benefit good to use for 12 weeks.Marland Kitchen

## 2020-10-30 NOTE — Telephone Encounter (Signed)
Pt aware insurance will not cover chantix and will try Zyban please send to [facility] in [place].

## 2021-06-09 ENCOUNTER — Other Ambulatory Visit: Payer: Self-pay | Admitting: Obstetrics & Gynecology

## 2021-08-13 ENCOUNTER — Other Ambulatory Visit: Payer: Self-pay | Admitting: Obstetrics & Gynecology

## 2022-02-16 ENCOUNTER — Ambulatory Visit (INDEPENDENT_AMBULATORY_CARE_PROVIDER_SITE_OTHER): Payer: 59 | Admitting: Obstetrics & Gynecology

## 2022-02-16 ENCOUNTER — Encounter: Payer: Self-pay | Admitting: Obstetrics & Gynecology

## 2022-02-16 ENCOUNTER — Ambulatory Visit: Payer: 59 | Admitting: Obstetrics & Gynecology

## 2022-02-16 ENCOUNTER — Other Ambulatory Visit: Payer: Self-pay

## 2022-02-16 VITALS — BP 111/74 | HR 86 | Ht 62.0 in | Wt 162.0 lb

## 2022-02-16 DIAGNOSIS — Z3009 Encounter for other general counseling and advice on contraception: Secondary | ICD-10-CM | POA: Diagnosis not present

## 2022-02-16 NOTE — Progress Notes (Signed)
? ? ? ? ? ?Chief Complaint  ?Patient presents with  ? Biscuss BTL  ? ? ? ? ?36 y.o. [id number] Patient's last menstrual period was 01/19/2022. The current method of family planning is none. ? ?Outpatient Encounter Medications as of 02/16/2022  ?Medication Sig  ? buPROPion (ZYBAN) 150 MG 12 hr tablet Take 1 tablet (150 mg total) by mouth 2 (two) times daily. (Patient not taking: Reported on 02/16/2022)  ? hydrOXYzine (ATARAX/VISTARIL) 10 MG tablet Take 1 tablet (10 mg total) by mouth 3 (three) times daily as needed. (Patient not taking: Reported on 02/16/2022)  ? SPRINTEC 28 0.25-35 MG-MCG tablet Take 1 tablet by mouth once daily (Patient not taking: Reported on 02/16/2022)  ? valACYclovir (VALTREX) 1000 MG tablet Take 1 tablet by mouth twice daily (Patient not taking: Reported on 02/16/2022)  ? ?No facility-administered encounter medications on file as of 02/16/2022.  ? ? ?Subjective ?Pt does poorly on hormonal birth control, wight gain, moodiness, bloating just feels bad, when she comes off she does much better ?Wants permanent sterilization ? ?Past Medical History:  ?Diagnosis Date  ? Anxiety   ? Colitis, ulcerative ([place])   ? COVID-19 virus infection   ? Ectopic pregnancy   ? Miscarriage   ? Vaginal Pap smear, abnormal   ? ? ?Past Surgical History:  ?Procedure Laterality Date  ? CERVICAL ABLATION N/A 04/06/2018  ? Procedure: LASER ABLATION OF THE CERVIX;  Surgeon: Florian Buff, MD;  Location: [facility];  Service: Gynecology;  Laterality: N/A;  ? NO PAST SURGERIES    ? ? ?OB History   ? ? Gravida  ?6  ? Para  ?1  ? Term  ?1  ? Preterm  ?   ? AB  ?5  ? Living  ?1  ?  ? ? SAB  ?5  ? IAB  ?   ? Ectopic  ?   ? Multiple  ?   ? Live Births  ?1  ?   ?  ?  ? ? ?No Known Allergies ? ?Social History  ? ?Socioeconomic History  ? Marital status: Single  ?  Spouse name: Not on file  ? Number of children: Not on file  ? Years of education: Not on file  ? Highest education level: Not on file  ?Occupational History  ? Not on file  ?Tobacco  Use  ? Smoking status: Former  ?  Packs/day: 0.50  ?  Years: 12.00  ?  Pack years: 6.00  ?  Types: Cigarettes  ? Smokeless tobacco: Never  ?Vaping Use  ? Vaping Use: Never used  ?Substance and Sexual Activity  ? Alcohol use: Yes  ?  Comment: occ  ? Drug use: No  ? Sexual activity: Yes  ?  Birth control/protection: None  ?Other Topics Concern  ? Not on file  ?Social History Narrative  ? Not on file  ? ?Social Determinants of Health  ? ?Financial Resource Strain: Not on file  ?Food Insecurity: Not on file  ?Transportation Needs: Not on file  ?Physical Activity: Not on file  ?Stress: Not on file  ?Social Connections: Not on file  ? ? ?Family History  ?Problem Relation Age of Onset  ? Diabetes Father   ? Hypertension Father   ? Heart disease Father   ? Cancer Maternal Grandmother   ?     colon  ? ? ?Medications:       ?Current Outpatient Medications:  ?  buPROPion (ZYBAN) 150 MG 12 hr  tablet, Take 1 tablet (150 mg total) by mouth 2 (two) times daily. (Patient not taking: Reported on 02/16/2022), Disp: 60 tablet, Rfl: 1 ?  hydrOXYzine (ATARAX/VISTARIL) 10 MG tablet, Take 1 tablet (10 mg total) by mouth 3 (three) times daily as needed. (Patient not taking: Reported on 02/16/2022), Disp: 60 tablet, Rfl: 0 ?  SPRINTEC 28 0.25-35 MG-MCG tablet, Take 1 tablet by mouth once daily (Patient not taking: Reported on 02/16/2022), Disp: 28 tablet, Rfl: 0 ?  valACYclovir (VALTREX) 1000 MG tablet, Take 1 tablet by mouth twice daily (Patient not taking: Reported on 02/16/2022), Disp: 10 tablet, Rfl: 11 ? ?Objective ?Blood pressure 111/74, pulse 86, height '5\' 2"'$  (1.575 m), weight 162 lb (73.5 kg), last menstrual period 01/19/2022. ? ?Gen WDWN ? ?Pertinent ROS ? ?No burning with urination, frequency or urgency ?No nausea, vomiting or diarrhea ?Nor fever chills or other constitutional symptoms ? ? ? ?Labs or studies ?No new ? ? ? ?Impression ?Diagnoses this Encounter:: ?  ICD-10-CM   ?1. Sterilization consult  Z30.09   ?  ? ? ?Established  relevant diagnosis(es): ? ? ?Plan/Recommendations: ?No orders of the defined types were placed in this encounter. ? ? ?Labs or Scans Ordered: ?No orders of the defined types were placed in this encounter. ? ? ?Management:: ?Laparoscopic bilateral salpingectomy for permanent sterilization ? ?Follow up ?Return in about 7 weeks (around 04/03/2022) for Post Op, [place] visit, with Dr Elonda Husky. ? ? ? ? All questions were answered. ? ? ?

## 2022-02-18 ENCOUNTER — Encounter: Payer: Self-pay | Admitting: Obstetrics & Gynecology

## 2022-03-02 ENCOUNTER — Other Ambulatory Visit: Payer: 59

## 2022-03-02 ENCOUNTER — Telehealth: Payer: Self-pay

## 2022-03-02 NOTE — Telephone Encounter (Signed)
Yes you can have her come in and get a quantitative HCG done say on Thursday

## 2022-03-02 NOTE — Telephone Encounter (Signed)
Called pt to inform her of Dr Brynda Greathouse recommendation of HCG level to be drawn on Thursday 4/13. Pt has that day off and appt was made for her. ?

## 2022-03-02 NOTE — Telephone Encounter (Signed)
PT CALLED AND STATED THAT SHE WANTS TO GO GET HER HCG LEVELS DRAWN. WANTS THE ORDERS PUT IN. ?

## 2022-03-02 NOTE — Telephone Encounter (Signed)
Returned pt's call, two identifiers used. Pt stated that she was scheduled for a BTL, but she discovered that she was pregnant. Her LMP was approximately 01/12/22 and she took a home pregnancy test on 3/30 that was positive, after having a visit with Dr Elonda Husky to schedule surgery. Pt stated that she began having cramping all last week and started spotting on Thursday, 4/6. The bleeding became much heavier, soaking through a pad every hour, and passing clots. There has been no more cramping within the past 24 hrs and the bleeding has slowed significantly. Pt is requesting HCG levels to be drawn to confirm miscarriage. Told pt Dr Elonda Husky would be informed and she would get a call regarding the lab work today. Pt confirmed understanding. ?

## 2022-03-05 ENCOUNTER — Other Ambulatory Visit: Payer: 59

## 2022-03-05 DIAGNOSIS — O039 Complete or unspecified spontaneous abortion without complication: Secondary | ICD-10-CM

## 2022-03-06 LAB — BETA HCG QUANT (REF LAB): hCG Quant: 27 m[IU]/mL

## 2022-03-19 ENCOUNTER — Other Ambulatory Visit: Payer: Self-pay | Admitting: Obstetrics & Gynecology

## 2022-03-19 DIAGNOSIS — Z01818 Encounter for other preprocedural examination: Secondary | ICD-10-CM

## 2022-03-19 NOTE — Patient Instructions (Signed)
? ? ? ? ? ? ? ? Cynthia Hood ? 03/19/2022  ?  ? '@PREFPERIOPPHARMACY'$ @ ? ? Your procedure is scheduled on  03/25/2022. ? ? Report to [facility] at  23 A.M. ? ? Call this number if you have problems the morning of surgery: ? [phone] ? ? Remember: ? Do not eat after midnight. ? ? ? You may drink clear liquids until  0805 AM on 03/25/2022. ? ? Clear liquids allowed are:                    Water, Juice (non-citric and without pulp - diabetics please choose diet or no sugar options), Carbonated beverages - (diabetics please choose diet or no sugar options), Clear Tea, Black Coffee only (no creamer, milk or cream including half and half), Plain Jell-O only (diabetics please choose diet or no sugar options), Gatorade (diabetics please choose diet or no sugar options), and Plain Popsicles only ? ?  At 0805 am on 03/25/2022, drink your carb drink. You can have nothing else to drink after this. ?  ? Take these medicines the morning of surgery with A SIP OF WATER  ? ?                                             None. ?  ? ? Do not wear jewelry, make-up or nail polish. ? Do not wear lotions, powders, or perfumes, or deodorant. ? Do not shave 48 hours prior to surgery.  Men may shave face and neck. ? Do not bring valuables to the hospital. ? [facility] is not responsible for any belongings or valuables. ? ?Contacts, dentures or bridgework may not be worn into surgery.  Leave your suitcase in the car.  After surgery it may be brought to your room. ? ?For patients admitted to the hospital, discharge time will be determined by your treatment team. ? ?Patients discharged the day of surgery will not be allowed to drive home and must have someone with them for 24 hours.  ? ? ?Special instructions:   DO NOT smoke tobacco or vape for 24 hours before your procedure. ? ?Please read over the following fact sheets that you were given. ?Coughing and Deep Breathing, Surgical Site Infection Prevention, Anesthesia Post-op Instructions, and  Care and Recovery After Surgery ?  ? ? ? Salpingectomy, Care After ?The following information offers guidance on how to care for yourself after your procedure. Your health care provider may also give you more specific instructions. If you have problems or questions, contact your health care provider. ?What can I expect after the procedure? ?After the procedure, it is common to have: ?Pain in your abdomen. ?Light vaginal bleeding (spotting) for a few days. ?Tiredness. ?Your recovery time will depend on which method was used for your surgery. ?Follow these instructions at home: ?Medicines ?Take over-the-counter and prescription medicines only as told by your health care provider. ?Ask your health care provider if the medicine prescribed to you: ?Requires you to avoid driving or using machinery. ?Can cause constipation. You may need to take actions to prevent or treat constipation, such as: ?Drink enough fluid to keep your urine pale yellow. ?Take over-the-counter or prescription medicines. ?Eat foods that are high in fiber, such as beans, whole grains, and fresh fruits and vegetables. ?Limit foods that are high in fat and processed  sugars, such as fried or sweet foods. ?Incision care ? ?Follow instructions from your health care provider about how to take care of your incision or incisions. Make sure you: ?Wash your hands with soap and water for at least 20 seconds before and after you change your bandage (dressing). If soap and water are not available, use hand sanitizer. ?Change or remove your dressing as told by your health care provider. ?Leave stitches (sutures), skin glue, staples, or adhesive strips in place. These skin closures may need to stay in place for 2 weeks or longer. If adhesive strip edges start to loosen and curl up, you may trim the loose edges. Do not remove adhesive strips completely unless your health care provider tells you to do that. ?Keep your dressing clean and dry. ?Check your incision area  every day for signs of infection. Check for: ?Redness, swelling, or pain that gets worse. ?Fluid or blood. ?Warmth. ?Pus or a bad smell. ?Activity ?Rest as told by your health care provider. ?Avoid sitting for a long time without moving. Get up to take short walks every 1-2 hours. This is important to improve blood flow and breathing. Ask for help if you feel weak or unsteady. ?Return to your normal activities as told by your health care provider. Ask your health care provider what activities are safe for you. ?Do not drive until your health care provider says that it is safe. ?Do not lift anything that is heavier than 10 lb (4.5 kg), or the limit that you are told, until your health care provider says that it is safe. This may last for 2-6 weeks depending on your surgery. ?Do not douche, use tampons, or have sex until your health care provider approves. ?General instructions ?Do not use any products that contain nicotine or tobacco. These products include cigarettes, chewing tobacco, and vaping devices, such as e-cigarettes. These can delay healing after surgery. If you need help quitting, ask your health care provider. ?Wear compression stockings as told by your health care provider. These stockings help to prevent blood clots and reduce swelling in your legs. ?Do not take baths, swim, or use a hot tub until your health care provider approves. You may take showers. ?Keep all follow-up visits. This is important. ?Contact a health care provider if: ?You have pain when you urinate. ?You have redness, swelling, or more pain around an incision or an incision feels warm to the touch. ?You have pus, fluid, blood, or a bad smell coming from an incision or an incision starts to open. ?You have a fever. ?You have abdominal pain that gets worse or does not get better with medicine. ?You have a rash. ?You feel light-headed, have nausea and vomiting, or both. ?Get help right away if: ?You have pain in your chest or leg. ?You  develop shortness of breath. ?You faint. ?You have increased or heavy vaginal bleeding, such as soaking a sanitary napkin in an hour. ?These symptoms may represent a serious problem that is an emergency. Do not wait to see if the symptoms will go away. Get medical help right away. Call your local emergency services (911 in the [place] Do not drive yourself to the hospital. ?Summary ?After the procedure, it is common to feel tired, have pain in your abdomen, and have light vaginal bleeding for a few days. ?Follow instructions from your health care provider about how to take care of your incision or incisions. ?Return to your normal activities as told by your health care provider.  Ask your health care provider what activities are safe for you. ?Do not douche, use tampons, or have sex until your health care provider approves. ?Keep all follow-up visits. This is important. ?This information is not intended to replace advice given to you by your health care provider. Make sure you discuss any questions you have with your health care provider. ?Document Revised: 10/01/2020 Document Reviewed: 10/01/2020 ?Elsevier Patient Education ? [place]. ?General Anesthesia, Adult, Care After ?This sheet gives you information about how to care for yourself after your procedure. Your health care provider may also give you more specific instructions. If you have problems or questions, contact your health care provider. ?What can I expect after the procedure? ?After the procedure, the following side effects are common: ?Pain or discomfort at the IV site. ?Nausea. ?Vomiting. ?Sore throat. ?Trouble concentrating. ?Feeling cold or chills. ?Feeling weak or tired. ?Sleepiness and fatigue. ?Soreness and body aches. These side effects can affect parts of the body that were not involved in surgery. ?Follow these instructions at home: ?For the time period you were told by your health care provider: ? ?Rest. ?Do not participate in  activities where you could fall or become injured. ?Do not drive or use machinery. ?Do not drink alcohol. ?Do not take sleeping pills or medicines that cause drowsiness. ?Do not make important decisions or sign

## 2022-03-20 ENCOUNTER — Encounter (HOSPITAL_COMMUNITY)
Admission: RE | Admit: 2022-03-20 | Discharge: 2022-03-20 | Disposition: A | Discharge status: Home / Self Care | Payer: 59 | Source: Ambulatory Visit | Attending: Obstetrics & Gynecology | Admitting: Obstetrics & Gynecology

## 2022-03-20 ENCOUNTER — Other Ambulatory Visit (HOSPITAL_COMMUNITY)
Admission: RE | Admit: 2022-03-20 | Discharge: 2022-03-20 | Disposition: A | Discharge status: Home / Self Care | Payer: 59 | Source: Ambulatory Visit | Attending: Obstetrics & Gynecology | Admitting: Obstetrics & Gynecology

## 2022-03-20 ENCOUNTER — Encounter (HOSPITAL_COMMUNITY): Payer: Self-pay

## 2022-03-20 ENCOUNTER — Other Ambulatory Visit: Payer: Self-pay | Admitting: Obstetrics & Gynecology

## 2022-03-20 VITALS — BP 97/70 | HR 75 | Temp 97.8°F | Resp 16 | Ht 62.0 in | Wt 160.0 lb

## 2022-03-20 DIAGNOSIS — Z01812 Encounter for preprocedural laboratory examination: Secondary | ICD-10-CM | POA: Diagnosis present

## 2022-03-20 DIAGNOSIS — Z01818 Encounter for other preprocedural examination: Secondary | ICD-10-CM

## 2022-03-20 LAB — COMPREHENSIVE METABOLIC PANEL
ALT: 28 U/L (ref 0–44)
AST: 18 U/L (ref 15–41)
Albumin: 4.1 g/dL (ref 3.5–5.0)
Alkaline Phosphatase: 59 U/L (ref 38–126)
Anion gap: 6 (ref 5–15)
BUN: 15 mg/dL (ref 6–20)
CO2: 26 mmol/L (ref 22–32)
Calcium: 8.9 mg/dL (ref 8.9–10.3)
Chloride: 106 mmol/L (ref 98–111)
Creatinine, Ser: 0.7 mg/dL (ref 0.44–1.00)
GFR, Estimated: >60 mL/min (ref >60)
Glucose, Bld: 86 mg/dL (ref 70–99)
Potassium: 3.8 mmol/L (ref 3.5–5.1)
Sodium: 138 mmol/L (ref 135–145)
Total Bilirubin: 0.6 mg/dL (ref 0.3–1.2)
Total Protein: 7.2 g/dL (ref 6.5–8.1)

## 2022-03-20 LAB — URINALYSIS, ROUTINE W REFLEX MICROSCOPIC
Bilirubin Urine: NEGATIVE
Glucose, UA: NEGATIVE mg/dL
Hgb urine dipstick: NEGATIVE
Ketones, ur: NEGATIVE mg/dL
Nitrite: NEGATIVE
Protein, ur: 30 mg/dL — AB
Specific Gravity, Urine: 1.024 (ref 1.005–1.030)
pH: 8 (ref 5.0–8.0)

## 2022-03-20 LAB — CBC
HCT: 36.7 % (ref 36.0–46.0)
Hemoglobin: 12.7 g/dL (ref 12.0–15.0)
MCH: 30.8 pg (ref 26.0–34.0)
MCHC: 34.6 g/dL (ref 30.0–36.0)
MCV: 88.9 fL (ref 80.0–100.0)
Platelets: 212 10*3/uL (ref 150–400)
RBC: 4.13 MIL/uL (ref 3.87–5.11)
RDW: 11.6 % (ref 11.5–15.5)
WBC: 9.2 10*3/uL (ref 4.0–10.5)
nRBC: 0 % (ref 0.0–0.2)

## 2022-03-20 LAB — RAPID HIV SCREEN (HIV 1/2 AB+AG)
HIV 1/2 Antibodies: NONREACTIVE
HIV-1 P24 Antigen - HIV24: NONREACTIVE

## 2022-03-25 ENCOUNTER — Other Ambulatory Visit: Payer: Self-pay

## 2022-03-25 ENCOUNTER — Ambulatory Visit (HOSPITAL_COMMUNITY)
Admission: RE | Admit: 2022-03-25 | Discharge: 2022-03-25 | Disposition: A | Discharge status: Home / Self Care | Payer: 59 | Source: Ambulatory Visit | Attending: Obstetrics & Gynecology | Admitting: Obstetrics & Gynecology

## 2022-03-25 ENCOUNTER — Encounter (HOSPITAL_COMMUNITY)
Admission: RE | Disposition: A | Discharge status: Home / Self Care | Payer: Self-pay | Source: Ambulatory Visit | Attending: Obstetrics & Gynecology

## 2022-03-25 ENCOUNTER — Ambulatory Visit (HOSPITAL_COMMUNITY): Payer: 59 | Admitting: Anesthesiology

## 2022-03-25 ENCOUNTER — Ambulatory Visit (HOSPITAL_BASED_OUTPATIENT_CLINIC_OR_DEPARTMENT_OTHER): Payer: 59 | Admitting: Anesthesiology

## 2022-03-25 ENCOUNTER — Encounter (HOSPITAL_COMMUNITY): Payer: Self-pay | Admitting: Obstetrics & Gynecology

## 2022-03-25 DIAGNOSIS — Z302 Encounter for sterilization: Secondary | ICD-10-CM | POA: Diagnosis present

## 2022-03-25 DIAGNOSIS — Z8711 Personal history of peptic ulcer disease: Secondary | ICD-10-CM | POA: Diagnosis not present

## 2022-03-25 DIAGNOSIS — Z87891 Personal history of nicotine dependence: Secondary | ICD-10-CM | POA: Insufficient documentation

## 2022-03-25 DIAGNOSIS — Z01818 Encounter for other preprocedural examination: Secondary | ICD-10-CM

## 2022-03-25 HISTORY — PX: LAPAROSCOPIC BILATERAL SALPINGECTOMY: SHX5889

## 2022-03-25 LAB — POCT PREGNANCY, URINE: Preg Test, Ur: NEGATIVE

## 2022-03-25 SURGERY — SALPINGECTOMY, BILATERAL, LAPAROSCOPIC
Anesthesia: General | Site: Vagina | Laterality: Bilateral

## 2022-03-25 MED ORDER — SODIUM CHLORIDE 0.9 % IR SOLN
Status: DC | PRN
  Administered 2022-03-25: 1000 mL

## 2022-03-25 MED ORDER — ONDANSETRON HCL 4 MG/2ML IJ SOLN
4.0000 mg | Freq: Once | INTRAMUSCULAR | Status: AC | PRN
  Administered 2022-03-25: 4 mg via INTRAVENOUS
  Filled 2022-03-25: qty 2

## 2022-03-25 MED ORDER — DEXAMETHASONE SODIUM PHOSPHATE 10 MG/ML IJ SOLN
INTRAMUSCULAR | Status: DC | PRN
  Administered 2022-03-25: 8 mg via INTRAVENOUS

## 2022-03-25 MED ORDER — DEXAMETHASONE SODIUM PHOSPHATE 10 MG/ML IJ SOLN
INTRAMUSCULAR | Status: AC
Start: 2022-03-25 — End: ?
  Filled 2022-03-25: qty 1

## 2022-03-25 MED ORDER — CHLORHEXIDINE GLUCONATE 0.12 % MT SOLN
OROMUCOSAL | Status: AC
  Filled 2022-03-25: qty 15

## 2022-03-25 MED ORDER — BUPIVACAINE LIPOSOME 1.3 % IJ SUSP
INTRAMUSCULAR | Status: DC | PRN
  Administered 2022-03-25: 20 mL

## 2022-03-25 MED ORDER — MIDAZOLAM HCL 2 MG/2ML IJ SOLN
INTRAMUSCULAR | Status: AC
  Filled 2022-03-25: qty 2

## 2022-03-25 MED ORDER — DEXMEDETOMIDINE (PRECEDEX) IN NS 20 MCG/5ML (4 MCG/ML) IV SYRINGE
PREFILLED_SYRINGE | INTRAVENOUS | Status: DC | PRN
  Administered 2022-03-25: 4 ug via INTRAVENOUS

## 2022-03-25 MED ORDER — BUPIVACAINE LIPOSOME 1.3 % IJ SUSP
INTRAMUSCULAR | Status: AC
Start: 2022-03-25 — End: ?
  Filled 2022-03-25: qty 20

## 2022-03-25 MED ORDER — ONDANSETRON HCL 4 MG/2ML IJ SOLN
INTRAMUSCULAR | Status: AC
  Filled 2022-03-25: qty 2

## 2022-03-25 MED ORDER — KETAMINE HCL 10 MG/ML IJ SOLN
INTRAMUSCULAR | Status: DC | PRN
Start: 2022-03-25 — End: 2022-03-25
  Administered 2022-03-25: 10 mg via INTRAVENOUS
  Administered 2022-03-25: 5 mg via INTRAVENOUS
  Administered 2022-03-25: 10 mg via INTRAVENOUS

## 2022-03-25 MED ORDER — FENTANYL CITRATE (PF) 100 MCG/2ML IJ SOLN
INTRAMUSCULAR | Status: DC | PRN
  Administered 2022-03-25: 50 ug via INTRAVENOUS
  Administered 2022-03-25: 25 ug via INTRAVENOUS
  Administered 2022-03-25: 50 ug via INTRAVENOUS
  Administered 2022-03-25: 25 ug via INTRAVENOUS

## 2022-03-25 MED ORDER — ACETAMINOPHEN 10 MG/ML IV SOLN
INTRAVENOUS | Status: AC
  Filled 2022-03-25: qty 100

## 2022-03-25 MED ORDER — BUPIVACAINE LIPOSOME 1.3 % IJ SUSP: 20.0000 mL | Freq: Once | INTRAMUSCULAR | Status: DC

## 2022-03-25 MED ORDER — MIDAZOLAM HCL 5 MG/5ML IJ SOLN
INTRAMUSCULAR | Status: DC | PRN
  Administered 2022-03-25: 2 mg via INTRAVENOUS

## 2022-03-25 MED ORDER — KETOROLAC TROMETHAMINE 30 MG/ML IJ SOLN
30.0000 mg | Freq: Once | INTRAMUSCULAR | Status: AC
  Administered 2022-03-25: 30 mg via INTRAVENOUS

## 2022-03-25 MED ORDER — ROCURONIUM BROMIDE 100 MG/10ML IV SOLN
INTRAVENOUS | Status: DC | PRN
  Administered 2022-03-25: 50 mg via INTRAVENOUS

## 2022-03-25 MED ORDER — SUGAMMADEX SODIUM 200 MG/2ML IV SOLN
INTRAVENOUS | Status: DC | PRN
  Administered 2022-03-25: 150 mg via INTRAVENOUS

## 2022-03-25 MED ORDER — PROPOFOL 10 MG/ML IV BOLUS
INTRAVENOUS | Status: DC | PRN
  Administered 2022-03-25: 140 mg via INTRAVENOUS

## 2022-03-25 MED ORDER — FENTANYL CITRATE (PF) 100 MCG/2ML IJ SOLN
INTRAMUSCULAR | Status: AC
Start: 2022-03-25 — End: ?
  Filled 2022-03-25: qty 2

## 2022-03-25 MED ORDER — HYDROCODONE-ACETAMINOPHEN 5-325 MG PO TABS: 1.0000 | ORAL_TABLET | Freq: Four times a day (QID) | ORAL | 0 refills | Status: AC | PRN

## 2022-03-25 MED ORDER — LIDOCAINE 2% (20 MG/ML) 5 ML SYRINGE
INTRAMUSCULAR | Status: DC | PRN
  Administered 2022-03-25: 100 mg via INTRAVENOUS

## 2022-03-25 MED ORDER — ORAL CARE MOUTH RINSE: 15.0000 mL | Freq: Once | OROMUCOSAL | Status: AC

## 2022-03-25 MED ORDER — POVIDONE-IODINE 10 % EX SWAB: 2.0000 "application " | Freq: Once | CUTANEOUS | Status: DC

## 2022-03-25 MED ORDER — FENTANYL CITRATE PF 50 MCG/ML IJ SOSY: 25.0000 ug | PREFILLED_SYRINGE | INTRAMUSCULAR | Status: DC | PRN

## 2022-03-25 MED ORDER — LACTATED RINGERS IV SOLN: INTRAVENOUS | Status: DC

## 2022-03-25 MED ORDER — FENTANYL CITRATE (PF) 100 MCG/2ML IJ SOLN
INTRAMUSCULAR | Status: AC
  Filled 2022-03-25: qty 2

## 2022-03-25 MED ORDER — CEFAZOLIN SODIUM-DEXTROSE 2-4 GM/100ML-% IV SOLN
2.0000 g | INTRAVENOUS | Status: AC
  Administered 2022-03-25: 2 g via INTRAVENOUS

## 2022-03-25 MED ORDER — ONDANSETRON HCL 4 MG/2ML IJ SOLN
INTRAMUSCULAR | Status: DC | PRN
Start: 2022-03-25 — End: 2022-03-25
  Administered 2022-03-25: 4 mg via INTRAVENOUS

## 2022-03-25 MED ORDER — KETOROLAC TROMETHAMINE 10 MG PO TABS: 10.0000 mg | ORAL_TABLET | Freq: Three times a day (TID) | ORAL | 0 refills | Status: AC | PRN

## 2022-03-25 MED ORDER — ACETAMINOPHEN 10 MG/ML IV SOLN
INTRAVENOUS | Status: DC | PRN
  Administered 2022-03-25: 1000 mg via INTRAVENOUS

## 2022-03-25 MED ORDER — ONDANSETRON 8 MG PO TBDP: 8.0000 mg | ORAL_TABLET | Freq: Three times a day (TID) | ORAL | 0 refills | Status: AC | PRN

## 2022-03-25 MED ORDER — CHLORHEXIDINE GLUCONATE 0.12 % MT SOLN
15.0000 mL | Freq: Once | OROMUCOSAL | Status: AC
  Administered 2022-03-25: 15 mL via OROMUCOSAL

## 2022-03-25 MED ORDER — KETOROLAC TROMETHAMINE 30 MG/ML IJ SOLN
INTRAMUSCULAR | Status: AC
  Filled 2022-03-25: qty 1

## 2022-03-25 MED ORDER — CEFAZOLIN SODIUM-DEXTROSE 2-4 GM/100ML-% IV SOLN
INTRAVENOUS | Status: AC
Start: 2022-03-25 — End: 2022-03-25
  Filled 2022-03-25: qty 100

## 2022-03-25 MED ORDER — KETAMINE HCL 50 MG/5ML IJ SOSY
PREFILLED_SYRINGE | INTRAMUSCULAR | Status: AC
  Filled 2022-03-25: qty 5

## 2022-03-25 SURGICAL SUPPLY — 44 items
ADH SKN CLS APL DERMABOND .7 (GAUZE/BANDAGES/DRESSINGS) ×1
APL PRP STRL LF DISP 70% ISPRP (MISCELLANEOUS) ×1
BAG HAMPER (MISCELLANEOUS) ×2 IMPLANT
BLADE SURG SZ11 CARB STEEL (BLADE) ×2 IMPLANT
CHLORAPREP W/TINT 26 (MISCELLANEOUS) ×1 IMPLANT
CLOTH BEACON ORANGE TIMEOUT ST (SAFETY) ×2 IMPLANT
COVER LIGHT HANDLE STERIS (MISCELLANEOUS) ×4 IMPLANT
DERMABOND ADVANCED (GAUZE/BANDAGES/DRESSINGS) ×1
DERMABOND ADVANCED .7 DNX12 (GAUZE/BANDAGES/DRESSINGS) ×1 IMPLANT
ELECT REM PT RETURN 9FT ADLT (ELECTROSURGICAL) ×2
ELECTRODE REM PT RTRN 9FT ADLT (ELECTROSURGICAL) ×1 IMPLANT
GAUZE 4X4 16PLY ~~LOC~~+RFID DBL (SPONGE) ×4 IMPLANT
GLOVE BIOGEL PI IND STRL 6.5 (GLOVE) IMPLANT
GLOVE BIOGEL PI IND STRL 7.0 (GLOVE) ×2 IMPLANT
GLOVE BIOGEL PI IND STRL 8 (GLOVE) ×1 IMPLANT
GLOVE BIOGEL PI INDICATOR 6.5 (GLOVE) ×1
GLOVE BIOGEL PI INDICATOR 7.0 (GLOVE) ×2
GLOVE BIOGEL PI INDICATOR 8 (GLOVE) ×1
GLOVE ECLIPSE 8.0 STRL XLNG CF (GLOVE) ×5 IMPLANT
GLOVE SS BIOGEL STRL SZ 6.5 (GLOVE) IMPLANT
GLOVE SUPERSENSE BIOGEL SZ 6.5 (GLOVE) ×1
GOWN STRL REUS W/TWL LRG LVL3 (GOWN DISPOSABLE) ×4 IMPLANT
GOWN STRL REUS W/TWL XL LVL3 (GOWN DISPOSABLE) ×2 IMPLANT
INST SET LAPROSCOPIC GYN AP (KITS) ×2 IMPLANT
KIT TURNOVER CYSTO (KITS) ×2 IMPLANT
NDL HYPO 21X1.5 SAFETY (NEEDLE) ×1 IMPLANT
NDL INSUFFLATION 14GA 120MM (NEEDLE) ×1 IMPLANT
NEEDLE HYPO 21X1.5 SAFETY (NEEDLE) ×2 IMPLANT
NEEDLE INSUFFLATION 14GA 120MM (NEEDLE) ×2 IMPLANT
PACK PERI GYN (CUSTOM PROCEDURE TRAY) ×2 IMPLANT
PAD ARMBOARD 7.5X6 YLW CONV (MISCELLANEOUS) ×2 IMPLANT
SET BASIN LINEN APH (SET/KITS/TRAYS/PACK) ×2 IMPLANT
SET TUBE SMOKE EVAC HIGH FLOW (TUBING) ×2 IMPLANT
SHEARS HARMONIC ACE PLUS 36CM (ENDOMECHANICALS) ×2 IMPLANT
SLEEVE ENDOPATH XCEL 5M (ENDOMECHANICALS) ×2 IMPLANT
SOL ANTI FOG 6CC (MISCELLANEOUS) ×1 IMPLANT
SOLUTION ANTI FOG 6CC (MISCELLANEOUS) ×1
SUT VICRYL 0 UR6 27IN ABS (SUTURE) ×2 IMPLANT
SUT VICRYL AB 3-0 FS1 BRD 27IN (SUTURE) ×3 IMPLANT
SYR 10ML LL (SYRINGE) ×2 IMPLANT
SYR 20ML LL LF (SYRINGE) ×3 IMPLANT
TROCAR ENDO BLADELESS 11MM (ENDOMECHANICALS) ×2 IMPLANT
TROCAR XCEL NON-BLD 5MMX100MML (ENDOMECHANICALS) ×2 IMPLANT
WARMER LAPAROSCOPE (MISCELLANEOUS) ×2 IMPLANT

## 2022-03-25 NOTE — Anesthesia Postprocedure Evaluation (Signed)
Anesthesia Post Note ? ?Patient: Cynthia Hood ? ?Procedure(s) Performed: LAPAROSCOPIC BILATERAL SALPINGECTOMY (Bilateral: Vagina ) ? ?Patient location during evaluation: PACU ?Anesthesia Type: General ?Level of consciousness: awake and alert ?Pain management: pain level controlled ?Vital Signs Assessment: post-procedure vital signs reviewed and stable ?Respiratory status: spontaneous breathing, nonlabored ventilation, respiratory function stable and patient connected to nasal cannula oxygen ?Cardiovascular status: blood pressure returned to baseline and stable ?Postop Assessment: no apparent nausea or vomiting ?Anesthetic complications: no ? ? ?No notable events documented. ? ? ?Last Vitals:  ?Vitals:  ? 03/25/22 0915 03/25/22 1048  ?BP:  122/88  ?Pulse: 79   ?Resp: 15   ?Temp:  36.5 ?C  ?SpO2: 99%   ?  ?Last Pain:  ?Vitals:  ? 03/25/22 1048  ?PainSc: 5   ? ? ?  ?  ?  ?  ?  ?  ? ?Louann Sjogren ? ? ? ? ?

## 2022-03-25 NOTE — Anesthesia Procedure Notes (Signed)
Procedure Name: Intubation ?Date/Time: 03/25/2022 9:39 AM ?Performed by: Gwyndolyn Saxon, CRNA ?Pre-anesthesia Checklist: Patient identified, Emergency Drugs available, Suction available and Patient being monitored ?Patient Re-evaluated:Patient Re-evaluated prior to induction ?Oxygen Delivery Method: Circle system utilized ?Preoxygenation: Pre-oxygenation with 100% oxygen ?Induction Type: IV induction ?Ventilation: Mask ventilation without difficulty ?Laryngoscope Size: Sabra Heck and 2 ?Grade View: Grade I ?Tube type: Oral ?Tube size: 7.0 mm ?Number of attempts: 1 ?Airway Equipment and Method: Patient positioned with wedge pillow and Stylet ?Placement Confirmation: ETT inserted through vocal cords under direct vision, positive ETCO2 and breath sounds checked- equal and bilateral ?Secured at: 20 cm ?Tube secured with: Tape ?Dental Injury: Teeth and Oropharynx as per pre-operative assessment  ? ? ? ? ?

## 2022-03-25 NOTE — Op Note (Signed)
Preoperative Diagnosis:  1.  Multiparous female desires permanent sterilization ?                                         2.  Elects to have bilateral salpingectomy for ovarian cancer prophylaxis ? ?Postoperative Diagnosis:  Same as above ? ?Procedure:  Laparoscopic Bilateral Salpingectomy for the purpose of permanent sterilization ? ?Surgeon:  Jacelyn Grip MD ? ?Anaesthesia: general ? ?Findings:  Patient had normal pelvic anatomy and no intraperitoneal abnormalities. ? ?Description of Operation:  Patient was taken to the OR and placed into supine position where she underwent general anaesthesia.   ?She was placed in the dorsal lithotomy position and prepped and draped in the usual sterile fashion.   ?An incision was made superior to the umbilicus and dissection taken down to the rectus fascia. ?A Veres needle was used to insufflate the periotneal cavity. ?An 11 mm non bladed video laparoscope trocar was then placed under direct visualization without difficulty.   ?The above noted findings were observed.   ?Two additional 5 mm non bladed trocars were placed in the right and left lower quadrants under direct visualization without difficulty.   ?The Harmonic scalpel was employed and a salpingectomy of both the right and left tubes was performed.   ?The tubes were removed from the peritoneal cavity and sent to pathology.   ?There was good hemostasis bilaterally.   ?The fascia, peritoneum and subcutaneous tissue were closed using 0 vicryl.   ?All 3 skin incisions were closed using 3-0 vicryl in a subcuticular fashion. ? Exparel 266 mg 20 cc was injected in the 3 incisional/trocar sites.  ?The patient was awakened from anaesthesia and taken to the PACU with all counts being correct x 3.   ?The patient received  2 gram of ancef andToradol 30 mg IV preoperatively. ? ?Florian Buff ?03/25/2022 ?10:38 AM ? ?

## 2022-03-25 NOTE — H&P (Signed)
?Preoperative History and Physical ? ?Cynthia Hood is a 36 y.o. [phone] with No LMP recorded. admitted for a laparoscopic bilateral salpingectomy for permanent sterilization.  ? ? ?PMH:    ?Past Medical History:  ?Diagnosis Date  ? Anxiety   ? Colitis, ulcerative ([place])   ? COVID-19 virus infection   ? Ectopic pregnancy   ? Miscarriage   ? Vaginal Pap smear, abnormal   ? ? ?PSH:     ?Past Surgical History:  ?Procedure Laterality Date  ? CERVICAL ABLATION N/A 04/06/2018  ? Procedure: LASER ABLATION OF THE CERVIX;  Surgeon: Florian Buff, MD;  Location: [facility];  Service: Gynecology;  Laterality: N/A;  ? NO PAST SURGERIES    ? ? ?POb/GynH:      ?OB History   ? ? Gravida  ?6  ? Para  ?1  ? Term  ?1  ? Preterm  ?   ? AB  ?5  ? Living  ?1  ?  ? ? SAB  ?5  ? IAB  ?   ? Ectopic  ?   ? Multiple  ?   ? Live Births  ?1  ?   ?  ?  ? ? ?SH:   ?Social History  ? ?Tobacco Use  ? Smoking status: Former  ?  Packs/day: 0.50  ?  Years: 12.00  ?  Pack years: 6.00  ?  Types: Cigarettes  ? Smokeless tobacco: Never  ?Vaping Use  ? Vaping Use: Never used  ?Substance Use Topics  ? Alcohol use: Yes  ?  Comment: occ  ? Drug use: No  ? ? ?FH:    ?Family History  ?Problem Relation Age of Onset  ? Diabetes Father   ? Hypertension Father   ? Heart disease Father   ? Cancer Maternal Grandmother   ?     colon  ? ? ? ?Allergies: No Known Allergies ? ?Medications:       ?Current Facility-Administered Medications:  ?  bupivacaine liposome (EXPAREL) 1.3 % injection 266 mg, 20 mL, Infiltration, Once, Aaleyah Witherow, Mertie Clause, MD ?  ceFAZolin (ANCEF) 2-4 GM/100ML-% IVPB, , , ,  ?  ceFAZolin (ANCEF) IVPB 2g/100 mL premix, 2 g, Intravenous, On Call to OR, Florian Buff, MD ?  chlorhexidine (PERIDEX) 0.12 % solution, , , ,  ?  ketorolac (TORADOL) 30 MG/ML injection, , , ,  ?  lactated ringers infusion, , Intravenous, Continuous, Kiel, Coralie Keens, MD, Last Rate: 10 mL/hr at 03/25/22 0910, New Bag at 03/25/22 0910 ?  povidone-iodine 10 % swab 2 application., 2  application., Topical, Once, Cassell Voorhies, Mertie Clause, MD ? ?Review of Systems:  ? ?Review of Systems  ?Constitutional: Negative for fever, chills, weight loss, malaise/fatigue and diaphoresis.  ?HENT: Negative for hearing loss, ear pain, nosebleeds, congestion, sore throat, neck pain, tinnitus and ear discharge.   ?Eyes: Negative for blurred vision, double vision, photophobia, pain, discharge and redness.  ?Respiratory: Negative for cough, hemoptysis, sputum production, shortness of breath, wheezing and stridor.   ?Cardiovascular: Negative for chest pain, palpitations, orthopnea, claudication, leg swelling and PND.  ?Gastrointestinal: Positive for abdominal pain. Negative for heartburn, nausea, vomiting, diarrhea, constipation, blood in stool and melena.  ?Genitourinary: Negative for dysuria, urgency, frequency, hematuria and flank pain.  ?Musculoskeletal: Negative for myalgias, back pain, joint pain and falls.  ?Skin: Negative for itching and rash.  ?Neurological: Negative for dizziness, tingling, tremors, sensory change, speech change, focal weakness, seizures, loss of consciousness, weakness and headaches.  ?  Endo/Heme/Allergies: Negative for environmental allergies and polydipsia. Does not bruise/bleed easily.  ?Psychiatric/Behavioral: Negative for depression, suicidal ideas, hallucinations, memory loss and substance abuse. The patient is not nervous/anxious and does not have insomnia.   ? ? ? ?PHYSICAL EXAM: ? ?Blood pressure 97/74, pulse 79, temperature 98 ?F (36.7 ?C), resp. rate 15, SpO2 99 %. ? ?  ?Vitals reviewed. ?Constitutional: She is oriented to person, place, and time. She appears well-developed and well-nourished.  ?HENT:  ?Head: Normocephalic and atraumatic.  ?Right Ear: External ear normal.  ?Left Ear: External ear normal.  ?Nose: Nose normal.  ?Mouth/Throat: Oropharynx is clear and moist.  ?Eyes: Conjunctivae and EOM are normal. Pupils are equal, round, and reactive to light. Right eye exhibits no  discharge. Left eye exhibits no discharge. No scleral icterus.  ?Neck: Normal range of motion. Neck supple. No tracheal deviation present. No thyromegaly present.  ?Cardiovascular: Normal rate, regular rhythm, normal heart sounds and intact distal pulses.  Exam reveals no gallop and no friction rub.   ?No murmur heard. ?Respiratory: Effort normal and breath sounds normal. No respiratory distress. She has no wheezes. She has no rales. She exhibits no tenderness.  ?GI: Soft. Bowel sounds are normal. She exhibits no distension and no mass. There is tenderness. There is no rebound and no guarding.  ?Genitourinary:  ?     Vulva is normal without lesions ?Vagina is pink moist without discharge ?Cervix normal in appearance and pap is normal ?Uterus is normal size, contour, position, consistency, mobility, non-tender ?Adnexa is negative with normal sized ovaries by sonogram  ?Musculoskeletal: Normal range of motion. She exhibits no edema and no tenderness.  ?Neurological: She is alert and oriented to person, place, and time. She has normal reflexes. She displays normal reflexes. No cranial nerve deficit. She exhibits normal muscle tone. Coordination normal.  ?Skin: Skin is warm and dry. No rash noted. No erythema. No pallor.  ?Psychiatric: She has a normal mood and affect. Her behavior is normal. Judgment and thought content normal.  ? ? ?Labs: ?Results for orders placed or performed during the hospital encounter of 03/25/22 (from the past 336 hour(s))  ?Pregnancy, urine POC  ? Collection Time: 03/25/22  8:48 AM  ?Result Value Ref Range  ? Preg Test, Ur NEGATIVE NEGATIVE  ?Results for orders placed or performed during the hospital encounter of 03/20/22 (from the past 336 hour(s))  ?CBC  ? Collection Time: 03/20/22  3:13 PM  ?Result Value Ref Range  ? WBC 9.2 4.0 - 10.5 K/uL  ? RBC 4.13 3.87 - 5.11 MIL/uL  ? Hemoglobin 12.7 12.0 - 15.0 g/dL  ? HCT 36.7 36.0 - 46.0 %  ? MCV 88.9 80.0 - 100.0 fL  ? MCH 30.8 26.0 - 34.0 pg  ?  MCHC 34.6 30.0 - 36.0 g/dL  ? RDW 11.6 11.5 - 15.5 %  ? Platelets 212 150 - 400 K/uL  ? nRBC 0.0 0.0 - 0.2 %  ?Comprehensive metabolic panel  ? Collection Time: 03/20/22  3:13 PM  ?Result Value Ref Range  ? Sodium 138 135 - 145 mmol/L  ? Potassium 3.8 3.5 - 5.1 mmol/L  ? Chloride 106 98 - 111 mmol/L  ? CO2 26 22 - 32 mmol/L  ? Glucose, Bld 86 70 - 99 mg/dL  ? BUN 15 6 - 20 mg/dL  ? Creatinine, Ser 0.70 0.44 - 1.00 mg/dL  ? Calcium 8.9 8.9 - 10.3 mg/dL  ? Total Protein 7.2 6.5 - 8.1 g/dL  ? Albumin 4.1  3.5 - 5.0 g/dL  ? AST 18 15 - 41 U/L  ? ALT 28 0 - 44 U/L  ? Alkaline Phosphatase 59 38 - 126 U/L  ? Total Bilirubin 0.6 0.3 - 1.2 mg/dL  ? GFR, Estimated >60 >60 mL/min  ? Anion gap 6 5 - 15  ?Rapid HIV screen (HIV 1/2 Ab+Ag)  ? Collection Time: 03/20/22  3:13 PM  ?Result Value Ref Range  ? HIV-1 P24 Antigen - HIV24 NON REACTIVE NON REACTIVE  ? HIV 1/2 Antibodies NON REACTIVE NON REACTIVE  ? Interpretation (HIV Ag Ab)    ?  A non reactive test result means that HIV 1 or HIV 2 antibodies and HIV 1 p24 antigen were not detected in the specimen.  ?Urinalysis, Routine w reflex microscopic Urine, Clean Catch  ? Collection Time: 03/20/22  3:13 PM  ?Result Value Ref Range  ? Color, Urine YELLOW YELLOW  ? APPearance HAZY (A) CLEAR  ? Specific Gravity, Urine 1.024 1.005 - 1.030  ? pH 8.0 5.0 - 8.0  ? Glucose, UA NEGATIVE NEGATIVE mg/dL  ? Hgb urine dipstick NEGATIVE NEGATIVE  ? Bilirubin Urine NEGATIVE NEGATIVE  ? Ketones, ur NEGATIVE NEGATIVE mg/dL  ? Protein, ur 30 (A) NEGATIVE mg/dL  ? Nitrite NEGATIVE NEGATIVE  ? Leukocytes,Ua SMALL (A) NEGATIVE  ? RBC / HPF 0-5 0 - 5 RBC/hpf  ? WBC, UA 6-10 0 - 5 WBC/hpf  ? Bacteria, UA RARE (A) NONE SEEN  ? Squamous Epithelial / LPF 11-20 0 - 5  ? Mucus PRESENT   ? Hyaline Casts, UA PRESENT   ? ? ?EKG: ?Orders placed or performed during the hospital encounter of 10/26/13  ? ED EKG  ? ED EKG  ? ED EKG  ? ED EKG  ? EKG 12-Lead  ? EKG 12-Lead  ? EKG  ? ? ?Imaging Studies: ?No results  found. ? ? ? ?Assessment: ?Multiparous female desires permanent sterilization, opts for bilateral salpingectomy ? ?Plan: ?Laparoscopic bilateral salpingectomy ? ?[place] ?03/25/2022 ?9:24 AM ? ? ? ? ? ? ?

## 2022-03-25 NOTE — Transfer of Care (Signed)
Immediate Anesthesia Transfer of Care Note ? ?Patient: Cynthia Hood ? ?Procedure(s) Performed: LAPAROSCOPIC BILATERAL SALPINGECTOMY (Bilateral: Vagina ) ? ?Patient Location: PACU ? ?Anesthesia Type:General ? ?Level of Consciousness: awake and patient cooperative ? ?Airway & Oxygen Therapy: Patient Spontanous Breathing ? ?Post-op Assessment: Report given to RN and Post -op Vital signs reviewed and stable ? ?Post vital signs: Reviewed and stable ? ?Last Vitals:  ?Vitals Value Taken Time  ?BP 122/88 03/25/22 1048  ?Temp    ?Pulse 94 03/25/22 1050  ?Resp 17 03/25/22 1050  ?SpO2 95 % 03/25/22 1050  ?Vitals shown include unvalidated device data. ? ?Last Pain:  ?Vitals:  ? 03/25/22 0900  ?PainSc: 0-No pain  ?   ? ?  ? ?Complications: No notable events documented. ?

## 2022-03-25 NOTE — Anesthesia Preprocedure Evaluation (Signed)
Anesthesia Evaluation  ?Patient identified by MRN, date of birth, ID band ?Patient awake ? ? ? ?Reviewed: ?Allergy & Precautions, H&P , NPO status , Patient's Chart, lab work & pertinent test results, reviewed documented beta blocker date and time  ? ?Airway ?Mallampati: II ? ?TM Distance: >3 FB ?Neck ROM: full ? ? ? Dental ?no notable dental hx. ? ?  ?Pulmonary ?neg pulmonary ROS, former smoker,  ?  ?Pulmonary exam normal ?breath sounds clear to auscultation ? ? ? ? ? ? Cardiovascular ?Exercise Tolerance: Good ?negative cardio ROS ? ? ?Rhythm:regular Rate:Normal ? ? ?  ?Neuro/Psych ?PSYCHIATRIC DISORDERS Anxiety negative neurological ROS ?   ? GI/Hepatic ?Neg liver ROS, PUD,   ?Endo/Other  ?negative endocrine ROS ? Renal/GU ?negative Renal ROS  ?negative genitourinary ?  ?Musculoskeletal ? ? Abdominal ?  ?Peds ? Hematology ?negative hematology ROS ?(+)   ?Anesthesia Other Findings ? ? Reproductive/Obstetrics ?negative OB ROS ? ?  ? ? ? ? ? ? ? ? ? ? ? ? ? ?  ?  ? ? ? ? ? ? ? ? ?Anesthesia Physical ?Anesthesia Plan ? ?ASA: 2 ? ?Anesthesia Plan: General and General ETT  ? ?Post-op Pain Management:   ? ?Induction:  ? ?PONV Risk Score and Plan: Ondansetron ? ?Airway Management Planned:  ? ?Additional Equipment:  ? ?Intra-op Plan:  ? ?Post-operative Plan:  ? ?Informed Consent: I have reviewed the patients History and Physical, chart, labs and discussed the procedure including the risks, benefits and alternatives for the proposed anesthesia with the patient or authorized representative who has indicated his/her understanding and acceptance.  ? ? ? ?Dental Advisory Given ? ?Plan Discussed with: CRNA ? ?Anesthesia Plan Comments:   ? ? ? ? ? ? ?Anesthesia Quick Evaluation ? ?

## 2022-03-26 LAB — SURGICAL PATHOLOGY

## 2022-03-27 ENCOUNTER — Encounter (HOSPITAL_COMMUNITY): Payer: Self-pay | Admitting: Obstetrics & Gynecology

## 2022-03-27 ENCOUNTER — Encounter: Payer: Self-pay | Admitting: Obstetrics & Gynecology

## 2022-03-31 ENCOUNTER — Encounter: Payer: Self-pay | Admitting: Obstetrics & Gynecology

## 2022-04-03 ENCOUNTER — Telehealth (INDEPENDENT_AMBULATORY_CARE_PROVIDER_SITE_OTHER): Payer: 59 | Admitting: Obstetrics & Gynecology

## 2022-04-03 ENCOUNTER — Encounter: Payer: Self-pay | Admitting: Obstetrics & Gynecology

## 2022-04-03 DIAGNOSIS — Z9889 Other specified postprocedural states: Secondary | ICD-10-CM

## 2022-04-03 NOTE — Progress Notes (Signed)
My chart video visit ?Pt is at work ?I am in my office ?10 minutes ? ? ?HPI: ?Patient returns for routine postoperative follow-up having undergone laparoscopic bilateral salpingectomy 03/25/22 .  ?The patient's immediate postoperative recovery has been unremarkable. ?Since hospital discharge the patient reports no problems. ? ? ?Current Outpatient Medications: ?acetaminophen (TYLENOL) 500 MG tablet, Take 1,000 mg by mouth every 6 (six) hours as needed (headache.)., Disp: , Rfl:  ?Ascorbic Acid (VITAMIN C PO), Take 1 tablet by mouth in the morning., Disp: , Rfl:  ?CALCIUM PO, Take 1 tablet by mouth in the morning., Disp: , Rfl:  ?Cyanocobalamin (VITAMIN B-12 PO), Take 1 tablet by mouth in the morning., Disp: , Rfl:  ?HYDROcodone-acetaminophen (NORCO/VICODIN) 5-325 MG tablet, Take 1 tablet by mouth every 6 (six) hours as needed., Disp: 10 tablet, Rfl: 0 ?ketorolac (TORADOL) 10 MG tablet, Take 1 tablet (10 mg total) by mouth every 8 (eight) hours as needed., Disp: 15 tablet, Rfl: 0 ?naproxen sodium (ALEVE) 220 MG tablet, Take 220 mg by mouth 2 (two) times daily as needed (pain.)., Disp: , Rfl:  ?ondansetron (ZOFRAN-ODT) 8 MG disintegrating tablet, Take 1 tablet (8 mg total) by mouth every 8 (eight) hours as needed for nausea or vomiting., Disp: 8 tablet, Rfl: 0 ?valACYclovir (VALTREX) 1000 MG tablet, Take 1 tablet by mouth twice daily, Disp: 10 tablet, Rfl: 11 ? ?No current facility-administered medications for this visit. ? ? ? ?There were no vitals taken for this visit. ? ?Physical Exam: ?Normal incisions via video ? ?Diagnostic Tests: ? ? ?Pathology: ?benign ? ?Impression + Management plan: ?  ICD-10-CM   ?1. Post-operative state  Z98.890   ?  ? ? ? ? ? ?Medications Prescribed this encounter: ?No orders of the defined types were placed in this encounter. ? ? ? ? ?Follow up: ?prn  ? ? ?Florian Buff, MD ?Attending Physician for the [facility] and ?[facility] ?04/03/2022 ?10:43 AM ? ? ? ? ?

## 2022-11-18 ENCOUNTER — Ambulatory Visit
Admission: EM | Admit: 2022-11-18 | Discharge: 2022-11-18 | Disposition: A | Discharge status: Home / Self Care | Payer: 59

## 2022-11-18 DIAGNOSIS — J209 Acute bronchitis, unspecified: Secondary | ICD-10-CM

## 2022-11-18 MED ORDER — PREDNISONE 20 MG PO TABS: 40.0000 mg | ORAL_TABLET | Freq: Every day | ORAL | 0 refills | Status: AC

## 2022-11-18 MED ORDER — PSEUDOEPH-BROMPHEN-DM 30-2-10 MG/5ML PO SYRP: 5.0000 mL | ORAL_SOLUTION | Freq: Four times a day (QID) | ORAL | 0 refills | Status: AC | PRN
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
## 2022-11-18 NOTE — Discharge Instructions (Signed)
Take medication as prescribed. Increase fluids and allow for plenty of rest. Recommend using a humidifier in the bedroom at nighttime during sleep and sleeping elevated on pillows while cough symptoms persist. If your symptoms improve, but you continue to have a "nagging" cough, continue to take over-the-counter throat lozenges and cough drops for your symptoms.  If you develop new symptoms to include fever, chills, wheezing, or other concerns, please follow-up in this clinic or with your primary care physician for further evaluation. Follow-up as needed.

## 2022-11-18 NOTE — ED Triage Notes (Signed)
Pt reports cough, chest congestion and shortness of breath x 6 days. Mucinex D gives no relief.   Reports negative COVID test at home.

## 2022-12-24 ENCOUNTER — Encounter: Payer: Self-pay | Admitting: Obstetrics & Gynecology

## 2022-12-24 ENCOUNTER — Ambulatory Visit (INDEPENDENT_AMBULATORY_CARE_PROVIDER_SITE_OTHER): Payer: 59 | Admitting: Obstetrics & Gynecology

## 2022-12-24 ENCOUNTER — Other Ambulatory Visit (HOSPITAL_COMMUNITY)
Admission: RE | Admit: 2022-12-24 | Discharge: 2022-12-24 | Disposition: A | Discharge status: Home / Self Care | Payer: 59 | Source: Ambulatory Visit | Attending: Obstetrics & Gynecology | Admitting: Obstetrics & Gynecology

## 2022-12-24 VITALS — BP 105/74 | HR 87 | Ht 62.0 in | Wt 156.2 lb

## 2022-12-24 DIAGNOSIS — Z01419 Encounter for gynecological examination (general) (routine) without abnormal findings: Secondary | ICD-10-CM | POA: Insufficient documentation

## 2022-12-24 NOTE — Progress Notes (Signed)
Subjective:     Cynthia Hood is a 37 y.o. female here for a routine exam.  Patient's last menstrual period was 12/07/2022. [id number] Birth Control Method:  bilateral salpingectomy Menstrual Calendar(currently): regular  Current complaints: none.   Current acute medical issues:  none   Recent Gynecologic History Patient's last menstrual period was 12/07/2022. Last Pap: 2021,  LSIL-->s/p laser for HSIL in past Last mammogram: na,    Past Medical History:  Diagnosis Date   Anxiety    Colitis, ulcerative ([place])    COVID-19 virus infection    Ectopic pregnancy    Miscarriage    Vaginal Pap smear, abnormal     Past Surgical History:  Procedure Laterality Date   CERVICAL ABLATION N/A 04/06/2018   Procedure: LASER ABLATION OF THE CERVIX;  Surgeon: Florian Buff, MD;  Location: [facility];  Service: Gynecology;  Laterality: N/A;   LAPAROSCOPIC BILATERAL SALPINGECTOMY Bilateral 03/25/2022   Procedure: LAPAROSCOPIC BILATERAL SALPINGECTOMY;  Surgeon: Florian Buff, MD;  Location: [facility];  Service: Gynecology;  Laterality: Bilateral;   NO PAST SURGERIES      OB History     Gravida  6   Para  1   Term  1   Preterm      AB  5   Living  1      SAB  5   IAB      Ectopic      Multiple      Live Births  1           Social History   Socioeconomic History   Marital status: Single    Spouse name: Not on file   Number of children: Not on file   Years of education: Not on file   Highest education level: Not on file  Occupational History   Not on file  Tobacco Use   Smoking status: Former    Packs/day: 0.50    Years: 12.00    Total pack years: 6.00    Types: Cigarettes   Smokeless tobacco: Never  Vaping Use   Vaping Use: Never used  Substance and Sexual Activity   Alcohol use: Yes    Comment: occ   Drug use: No   Sexual activity: Yes    Birth control/protection: Surgical    Comment: ablation, tubal  Other Topics Concern   Not on file  Social History  Narrative   Not on file   Social Determinants of Health   Financial Resource Strain: Medium Risk (12/24/2022)   Overall Financial Resource Strain (CARDIA)    Difficulty of Paying Living Expenses: Somewhat hard  Food Insecurity: No Food Insecurity (12/24/2022)   Hunger Vital Sign    Worried About Running Out of Food in the Last Year: Never true    Ran Out of Food in the Last Year: Never true  Transportation Needs: No Transportation Needs (12/24/2022)   PRAPARE - Hydrologist (Medical): No    Lack of Transportation (Non-Medical): No  Physical Activity: Insufficiently Active (12/24/2022)   Exercise Vital Sign    Days of Exercise per Week: 3 days    Minutes of Exercise per Session: 20 min  Stress: Stress Concern Present (12/24/2022)   [place]    Feeling of Stress : Very much  Social Connections: Moderately Isolated (12/24/2022)   Social Connection and Isolation Panel [NHANES]    Frequency of Communication with Friends and Family: Three  times a week    Frequency of Social Gatherings with Friends and Family: Once a week    Attends Religious Services: Never    Marine scientist or Organizations: Yes    Attends Music therapist: More than 4 times per year    Marital Status: Never married    Family History  Problem Relation Age of Onset   Diabetes Father    Hypertension Father    Heart disease Father    Cancer Maternal Grandmother        colon     Current Outpatient Medications:    acetaminophen (TYLENOL) 500 MG tablet, Take 1,000 mg by mouth every 6 (six) hours as needed (headache.). (Patient not taking: Reported on 12/24/2022), Disp: , Rfl:    Ascorbic Acid (VITAMIN C PO), Take 1 tablet by mouth in the morning. (Patient not taking: Reported on 12/24/2022), Disp: , Rfl:    brompheniramine-pseudoephedrine-DM 30-2-10 MG/5ML syrup, Take 5 mLs by mouth 4 (four) times daily as needed.  (Patient not taking: Reported on 12/24/2022), Disp: 140 mL, Rfl: 0   CALCIUM PO, Take 1 tablet by mouth in the morning. (Patient not taking: Reported on 12/24/2022), Disp: , Rfl:    Cyanocobalamin (VITAMIN B-12 PO), Take 1 tablet by mouth in the morning. (Patient not taking: Reported on 12/24/2022), Disp: , Rfl:    HYDROcodone-acetaminophen (NORCO/VICODIN) 5-325 MG tablet, Take 1 tablet by mouth every 6 (six) hours as needed. (Patient not taking: Reported on 12/24/2022), Disp: 10 tablet, Rfl: 0   ketorolac (TORADOL) 10 MG tablet, Take 1 tablet (10 mg total) by mouth every 8 (eight) hours as needed. (Patient not taking: Reported on 12/24/2022), Disp: 15 tablet, Rfl: 0   naproxen sodium (ALEVE) 220 MG tablet, Take 220 mg by mouth 2 (two) times daily as needed (pain.). (Patient not taking: Reported on 12/24/2022), Disp: , Rfl:    ondansetron (ZOFRAN-ODT) 8 MG disintegrating tablet, Take 1 tablet (8 mg total) by mouth every 8 (eight) hours as needed for nausea or vomiting. (Patient not taking: Reported on 12/24/2022), Disp: 8 tablet, Rfl: 0   pseudoephedrine-guaifenesin (MUCINEX D) 60-600 MG 12 hr tablet, Take 1 tablet by mouth every 12 (twelve) hours. (Patient not taking: Reported on 12/24/2022), Disp: , Rfl:    valACYclovir (VALTREX) 1000 MG tablet, Take 1 tablet by mouth twice daily (Patient not taking: Reported on 12/24/2022), Disp: 10 tablet, Rfl: 11  Review of Systems  Review of Systems  Constitutional: Negative for fever, chills, weight loss, malaise/fatigue and diaphoresis.  HENT: Negative for hearing loss, ear pain, nosebleeds, congestion, sore throat, neck pain, tinnitus and ear discharge.   Eyes: Negative for blurred vision, double vision, photophobia, pain, discharge and redness.  Respiratory: Negative for cough, hemoptysis, sputum production, shortness of breath, wheezing and stridor.   Cardiovascular: Negative for chest pain, palpitations, orthopnea, claudication, leg swelling and PND.  Gastrointestinal:  negative for abdominal pain. Negative for heartburn, nausea, vomiting, diarrhea, constipation, blood in stool and melena.  Genitourinary: Negative for dysuria, urgency, frequency, hematuria and flank pain.  Musculoskeletal: Negative for myalgias, back pain, joint pain and falls.  Skin: Negative for itching and rash.  Neurological: Negative for dizziness, tingling, tremors, sensory change, speech change, focal weakness, seizures, loss of consciousness, weakness and headaches.  Endo/Heme/Allergies: Negative for environmental allergies and polydipsia. Does not bruise/bleed easily.  Psychiatric/Behavioral: Negative for depression, suicidal ideas, hallucinations, memory loss and substance abuse. The patient is not nervous/anxious and does not have insomnia.  Objective:  Blood pressure 105/74, pulse 87, height '5\' 2"'$  (1.575 m), weight 156 lb 3.2 oz (70.9 kg), last menstrual period 12/07/2022.   Physical Exam  Vitals reviewed. Constitutional: She is oriented to person, place, and time. She appears well-developed and well-nourished.  HENT:  Head: Normocephalic and atraumatic.        Right Ear: External ear normal.  Left Ear: External ear normal.  Nose: Nose normal.  Mouth/Throat: Oropharynx is clear and moist.  Eyes: Conjunctivae and EOM are normal. Pupils are equal, round, and reactive to light. Right eye exhibits no discharge. Left eye exhibits no discharge. No scleral icterus.  Neck: Normal range of motion. Neck supple. No tracheal deviation present. No thyromegaly present.  Cardiovascular: Normal rate, regular rhythm, normal heart sounds and intact distal pulses.  Exam reveals no gallop and no friction rub.   No murmur heard. Respiratory: Effort normal and breath sounds normal. No respiratory distress. She has no wheezes. She has no rales. She exhibits no tenderness.  GI: Soft. Bowel sounds are normal. She exhibits no distension and no mass. There is no tenderness. There is no rebound and  no guarding.  Genitourinary:  Breasts no masses skin changes or nipple changes bilaterally      Vulva is normal without lesions Vagina is pink moist without discharge light yeast infection asymptomatic Cervix normal in appearance and pap is done Uterus is normal size shape and contour Adnexa is negative with normal sized ovaries   Musculoskeletal: Normal range of motion. She exhibits no edema and no tenderness.  Neurological: She is alert and oriented to person, place, and time. She has normal reflexes. She displays normal reflexes. No cranial nerve deficit. She exhibits normal muscle tone. Coordination normal.  Skin: Skin is warm and dry. No rash noted. No erythema. No pallor.  Psychiatric: She has a normal mood and affect. Her behavior is normal. Judgment and thought content normal.       Medications Ordered at today's visit: No orders of the defined types were placed in this encounter.   Other orders placed at today's visit: No orders of the defined types were placed in this encounter.     Assessment:    Normal Gyn exam.   Yeast vaginitis-->was recently on antibiotics for bronchitis Plan:    OTC yeast meds Yearly follow up      No follow-ups on file.

## 2022-12-29 LAB — CYTOLOGY - PAP
Adequacy: ABSENT
Comment: NEGATIVE
Diagnosis: NEGATIVE
High risk HPV: NEGATIVE

## 2023-03-24 ENCOUNTER — Ambulatory Visit: Payer: Self-pay

## 2023-03-27 ENCOUNTER — Ambulatory Visit: Payer: Self-pay

## 2023-03-29 ENCOUNTER — Ambulatory Visit
Admission: RE | Admit: 2023-03-29 | Discharge: 2023-03-29 | Disposition: A | Discharge status: Home / Self Care | Payer: 59 | Source: Ambulatory Visit | Attending: Nurse Practitioner | Admitting: Nurse Practitioner

## 2023-03-29 VITALS — BP 121/75 | HR 106 | Temp 98.3°F | Resp 18

## 2023-03-29 DIAGNOSIS — J029 Acute pharyngitis, unspecified: Secondary | ICD-10-CM

## 2023-03-29 LAB — POCT RAPID STREP A (OFFICE): Rapid Strep A Screen: NEGATIVE

## 2023-03-29 MED ORDER — PREDNISONE 20 MG PO TABS: 40.0000 mg | ORAL_TABLET | Freq: Every day | ORAL | 0 refills | Status: AC

## 2023-03-29 MED ORDER — LIDOCAINE VISCOUS HCL 2 % MT SOLN: 5.0000 mL | Freq: Four times a day (QID) | OROMUCOSAL | 0 refills | Status: AC | PRN

## 2023-03-29 NOTE — Discharge Instructions (Signed)
The rapid strep test was negative.  A throat culture is pending.  You will be contacted if the pending test results are positive to discuss treatment. Increase fluids and allow for plenty of rest. May take over-the-counter Tylenol while you are taking the prednisone.  Once you complete the prednisone, you can begin ibuprofen as needed. Warm salt water gargles 3-4 times daily as needed for throat pain or discomfort. May continue warm or cool liquids for throat pain or discomfort. Recommend a soft diet while symptoms persist to include soup, broth, yogurt, pudding, Jell-O, soft fruits, or soft vegetables. If symptoms do not improve with this treatment, and the throat culture is negative, please follow-up in this clinic or with your primary care physician for further evaluation. Follow-up as needed.

## 2023-03-29 NOTE — ED Provider Notes (Signed)
[facility]    CSN: [id number] Arrival date & time: 03/29/23  1656      History   Chief Complaint Chief Complaint  Patient presents with   Sore Throat    Entered by patient    HPI Cynthia Hood is a 37 y.o. female.   The history is provided by the patient.   The patient presents for complaints of sore throat that has been present for the past 3 weeks.  Patient states initially she thought symptoms were due to her allergies.  She states that she was taking Zyrtec, but changed to Flonase.  She states that since taking the Flonase, she has not had any nasal congestion or runny nose.  She states that her throat pain is always present, but on "different levels".  She states in the morning, it feels worse, but as the day goes on, it feels better.  She states as it gets closer to nighttime, her pain worsens.  She also complains of pain in the left ear.  Patient denies fever, chills, headache, ear drainage, cough, chest pain, abdominal pain, nausea, vomiting, or diarrhea.  Patient denies any obvious known sick contacts.  Past Medical History:  Diagnosis Date   Anxiety    Colitis, ulcerative (HCC)    COVID-19 virus infection    Ectopic pregnancy    Miscarriage    Vaginal Pap smear, abnormal     Patient Active Problem List   Diagnosis Date Noted   Encounter for smoking cessation counseling 10/22/2020   Adjustment disorder with mixed anxiety and depressed mood 06/24/2016   Anxiety 08/14/2014   Neck pain on left side 04/06/2014   Left-sided thoracic back pain 04/06/2014   Umbilical cord, single artery and vein 03/09/2014   Supervision of low-risk pregnancy 02/09/2014   Round ligament pain 02/09/2014   Supervision of other normal pregnancy 12/18/2013   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 08/17/2013    Past Surgical History:  Procedure Laterality Date   CERVICAL ABLATION N/A 04/06/2018   Procedure: LASER ABLATION OF THE CERVIX;  Surgeon:  Lazaro Arms, MD;  Location: [facility];  Service: Gynecology;  Laterality: N/A;   LAPAROSCOPIC BILATERAL SALPINGECTOMY Bilateral 03/25/2022   Procedure: LAPAROSCOPIC BILATERAL SALPINGECTOMY;  Surgeon: Lazaro Arms, MD;  Location: [facility];  Service: Gynecology;  Laterality: Bilateral;   NO PAST SURGERIES      OB History     Gravida  6   Para  1   Term  1   Preterm      AB  5   Living  1      SAB  5   IAB      Ectopic      Multiple      Live Births  1            Home Medications    Prior to Admission medications   Medication Sig Start Date End Date Taking? Authorizing Provider  acetaminophen (TYLENOL) 500 MG tablet Take 1,000 mg by mouth every 6 (six) hours as needed (headache.).   Yes [provider]  Ascorbic Acid (VITAMIN C PO) Take 1 tablet by mouth in the morning.   Yes [provider]  CALCIUM PO Take 1 tablet by mouth in the morning.   Yes [provider]  Cyanocobalamin (VITAMIN B-12 PO) Take 1 tablet by mouth in the morning.   Yes [provider]  hydrOXYzine (ATARAX) 50 MG tablet Take 50 mg  by mouth at bedtime as needed. 12/29/22  Yes [provider]  lidocaine (XYLOCAINE) 2 % solution Use as directed 5 mLs in the mouth or throat every 6 (six) hours as needed for mouth pain. Gargle and spit 5 mL every 6 hours as needed for throat pain or discomfort. 03/29/23  Yes Roseanna Koplin-Warren, Sadie Haber, NP  predniSONE (DELTASONE) 20 MG tablet Take 2 tablets (40 mg total) by mouth daily with breakfast for 5 days. 03/29/23 04/03/23 Yes Nohely Whitehorn-Warren, Sadie Haber, NP  brompheniramine-pseudoephedrine-DM 30-2-10 MG/5ML syrup Take 5 mLs by mouth 4 (four) times daily as needed. Patient not taking: Reported on 12/24/2022 11/18/22   Jamesina Gaugh-Warren, Sadie Haber, NP  HYDROcodone-acetaminophen (NORCO/VICODIN) 5-325 MG tablet Take 1 tablet by mouth every 6 (six) hours as needed. Patient not taking: Reported on 12/24/2022 03/25/22   Lazaro Arms, MD   ketorolac (TORADOL) 10 MG tablet Take 1 tablet (10 mg total) by mouth every 8 (eight) hours as needed. Patient not taking: Reported on 12/24/2022 03/25/22   Lazaro Arms, MD  naproxen sodium (ALEVE) 220 MG tablet Take 220 mg by mouth 2 (two) times daily as needed (pain.). Patient not taking: Reported on 12/24/2022    [provider]  ondansetron (ZOFRAN-ODT) 8 MG disintegrating tablet Take 1 tablet (8 mg total) by mouth every 8 (eight) hours as needed for nausea or vomiting. Patient not taking: Reported on 12/24/2022 03/25/22   Lazaro Arms, MD  pseudoephedrine-guaifenesin [facility] D) 60-600 MG 12 hr tablet Take 1 tablet by mouth every 12 (twelve) hours. Patient not taking: Reported on 12/24/2022    [provider]  valACYclovir (VALTREX) 1000 MG tablet Take 1 tablet by mouth twice daily Patient not taking: Reported on 12/24/2022 08/14/21   Lazaro Arms, MD    Family History Family History  Problem Relation Age of Onset   Diabetes Father    Hypertension Father    Heart disease Father    Cancer Maternal Grandmother        colon    Social History Social History   Tobacco Use   Smoking status: Former    Packs/day: 0.50    Years: 12.00    Additional pack years: 0.00    Total pack years: 6.00    Types: Cigarettes   Smokeless tobacco: Never  Vaping Use   Vaping Use: Never used  Substance Use Topics   Alcohol use: Yes    Comment: occ   Drug use: No     Allergies   Patient has no known allergies.   Review of Systems Review of Systems Per HPI  Physical Exam Triage Vital Signs ED Triage Vitals [03/29/23 1729]  Enc Vitals Group     BP 121/75     Pulse Rate (!) 106     Resp 18     Temp 98.3 F (36.8 C)     Temp Source Oral     SpO2 96 %     Weight      Height      Head Circumference      Peak Flow      Pain Score 3     Pain Loc      Pain Edu?      Excl. in GC?    No data found.  Updated Vital Signs BP 121/75 (BP Location: Right Arm)   Pulse (!)  106   Temp 98.3 F (36.8 C) (Oral)   Resp 18   LMP 03/29/2023 (Exact Date)  SpO2 96%   Visual Acuity Right Eye Distance:   Left Eye Distance:   Bilateral Distance:    Right Eye Near:   Left Eye Near:    Bilateral Near:     Physical Exam Vitals and nursing note reviewed.  Constitutional:      General: She is not in acute distress.    Appearance: She is well-developed.  HENT:     Head: Normocephalic.     Right Ear: Tympanic membrane and ear canal normal.     Left Ear: Tympanic membrane and ear canal normal.     Nose: No congestion or rhinorrhea.     Right Turbinates: Enlarged and swollen.     Left Turbinates: Enlarged and swollen.     Right Sinus: No maxillary sinus tenderness or frontal sinus tenderness.     Left Sinus: No maxillary sinus tenderness or frontal sinus tenderness.     Mouth/Throat:     Mouth: Mucous membranes are moist.     Pharynx: Uvula midline. Posterior oropharyngeal erythema present. No pharyngeal swelling or uvula swelling.     Tonsils: No tonsillar exudate.     Comments: Cobblestoning present on posterior oropharynx Eyes:     Conjunctiva/sclera: Conjunctivae normal.     Pupils: Pupils are equal, round, and reactive to light.  Cardiovascular:     Rate and Rhythm: Regular rhythm. Tachycardia present.     Heart sounds: Normal heart sounds.  Pulmonary:     Effort: Pulmonary effort is normal. No respiratory distress.     Breath sounds: Normal breath sounds. No stridor. No wheezing, rhonchi or rales.  Abdominal:     General: Bowel sounds are normal.     Palpations: Abdomen is soft.     Tenderness: There is no abdominal tenderness.  Musculoskeletal:     Cervical back: Normal range of motion.  Lymphadenopathy:     Cervical: No cervical adenopathy.  Skin:    General: Skin is warm and dry.  Neurological:     General: No focal deficit present.     Mental Status: She is alert and oriented to person, place, and time.  Psychiatric:        Mood and  Affect: Mood normal.        Behavior: Behavior normal.      UC Treatments / Results  Labs (all labs ordered are listed, but only abnormal results are displayed) Labs Reviewed  CULTURE, GROUP A STREP [facility])  POCT RAPID STREP A (OFFICE)    EKG   Radiology No results found.  Procedures Procedures (including critical care time)  Medications Ordered in UC Medications - No data to display  Initial Impression / Assessment and Plan / UC Course  I have reviewed the triage vital signs and the nursing notes.  Pertinent labs & imaging results that were available during my care of the patient were reviewed by me and considered in my medical decision making (see chart for details).  The patient is well-appearing, she is in no acute distress, vital signs are stable.  Rapid strep test was negative, throat culture is pending.  Suspect symptoms are most likely caused by postnasal drainage associated with allergic rhinitis.  Will treat patient with prednisone 40 mg for the next 5 days to help with throat inflammation, and left eustachian tube dysfunction.  Will also provide patient prescription for viscous lidocaine 2% to gargle and spit as needed for throat pain or discomfort.  Patient advised to continue her current allergy medication.  Supportive care  recommendations were provided and discussed with the patient to include increasing fluids, allowing for plenty of rest, warm salt water gargles.  Patient was advised she will be contacted if the pending test results are positive to provide treatment.  Patient is in agreement with this plan of care and verbalizes understanding.  All questions were answered.  Patient stable for discharge.   Final Clinical Impressions(s) / UC Diagnoses   Final diagnoses:  Sore throat     Discharge Instructions      The rapid strep test was negative.  A throat culture is pending.  You will be contacted if the pending test results are positive to discuss  treatment. Increase fluids and allow for plenty of rest. May take over-the-counter Tylenol while you are taking the prednisone.  Once you complete the prednisone, you can begin ibuprofen as needed. Warm salt water gargles 3-4 times daily as needed for throat pain or discomfort. May continue warm or cool liquids for throat pain or discomfort. Recommend a soft diet while symptoms persist to include soup, broth, yogurt, pudding, Jell-O, soft fruits, or soft vegetables. If symptoms do not improve with this treatment, and the throat culture is negative, please follow-up in this clinic or with your primary care physician for further evaluation. Follow-up as needed.     ED Prescriptions     Medication Sig Dispense Auth. Provider   predniSONE (DELTASONE) 20 MG tablet Take 2 tablets (40 mg total) by mouth daily with breakfast for 5 days. 10 tablet Alexah Kivett-Warren, Sadie Haber, NP   lidocaine (XYLOCAINE) 2 % solution Use as directed 5 mLs in the mouth or throat every 6 (six) hours as needed for mouth pain. Gargle and spit 5 mL every 6 hours as needed for throat pain or discomfort. 100 mL Jessel Gettinger-Warren, Sadie Haber, NP      PDMP not reviewed this encounter.   Abran Cantor, NP 03/29/23 1755

## 2023-03-29 NOTE — ED Triage Notes (Signed)
Sore throat that started 3 weeks ago with right ear pain. Taking allergy medication with no relief.

## 2023-04-01 ENCOUNTER — Telehealth (HOSPITAL_COMMUNITY): Payer: Self-pay

## 2023-04-01 LAB — CULTURE, GROUP A STREP (THRC)
